# Patient Record
Sex: Male | Born: 1980 | Race: White | Hispanic: No | Marital: Married | Smoking: Current every day smoker
Health system: Southern US, Community
[De-identification: ages and names within clinical notes are randomized; demographics above are authoritative.]

## PROBLEM LIST (undated history)

## (undated) DIAGNOSIS — N200 Calculus of kidney: Secondary | ICD-10-CM

## (undated) HISTORY — PX: LEG SURGERY: SHX1003

---

## 2015-10-01 ENCOUNTER — Observation Stay
Admission: EM | Admit: 2015-10-01 | Discharge: 2015-10-02 | Disposition: A | Discharge status: Home / Self Care | Payer: Self-pay | Attending: Urology | Admitting: Urology

## 2015-10-01 ENCOUNTER — Emergency Department: Payer: Self-pay

## 2015-10-01 DIAGNOSIS — Z87442 Personal history of urinary calculi: Secondary | ICD-10-CM | POA: Insufficient documentation

## 2015-10-01 DIAGNOSIS — R350 Frequency of micturition: Secondary | ICD-10-CM | POA: Insufficient documentation

## 2015-10-01 DIAGNOSIS — R3129 Other microscopic hematuria: Secondary | ICD-10-CM | POA: Insufficient documentation

## 2015-10-01 DIAGNOSIS — R300 Dysuria: Secondary | ICD-10-CM | POA: Insufficient documentation

## 2015-10-01 DIAGNOSIS — N23 Unspecified renal colic: Secondary | ICD-10-CM | POA: Insufficient documentation

## 2015-10-01 DIAGNOSIS — N132 Hydronephrosis with renal and ureteral calculous obstruction: Principal | ICD-10-CM | POA: Insufficient documentation

## 2015-10-01 DIAGNOSIS — R109 Unspecified abdominal pain: Secondary | ICD-10-CM | POA: Diagnosis present

## 2015-10-01 DIAGNOSIS — N134 Hydroureter: Secondary | ICD-10-CM | POA: Insufficient documentation

## 2015-10-01 HISTORY — DX: Calculus of kidney: N20.0

## 2015-10-01 LAB — URINALYSIS COMPLETE WITH MICROSCOPIC (ARMC ONLY)
Bilirubin Urine: NEGATIVE
Glucose, UA: NEGATIVE mg/dL
Ketones, ur: NEGATIVE mg/dL
LEUKOCYTES UA: NEGATIVE
Nitrite: NEGATIVE
PH: 6 (ref 5.0–8.0)
PROTEIN: 100 mg/dL — AB
SPECIFIC GRAVITY, URINE: 1.025 (ref 1.005–1.030)
WBC, UA: NONE SEEN WBC/hpf (ref 0–5)

## 2015-10-01 LAB — BASIC METABOLIC PANEL
Anion gap: 6 (ref 5–15)
BUN: 19 mg/dL (ref 6–20)
CALCIUM: 9.4 mg/dL (ref 8.9–10.3)
CO2: 27 mmol/L (ref 22–32)
CREATININE: 1.11 mg/dL (ref 0.61–1.24)
Chloride: 106 mmol/L (ref 101–111)
GFR calc Af Amer: >60 mL/min (ref >60)
GFR calc non Af Amer: >60 mL/min (ref >60)
GLUCOSE: 100 mg/dL — AB (ref 65–99)
Potassium: 4 mmol/L (ref 3.5–5.1)
Sodium: 139 mmol/L (ref 135–145)

## 2015-10-01 LAB — CBC
HEMATOCRIT: 46 % (ref 40.0–52.0)
Hemoglobin: 15.4 g/dL (ref 13.0–18.0)
MCH: 31.7 pg (ref 26.0–34.0)
MCHC: 33.5 g/dL (ref 32.0–36.0)
MCV: 94.7 fL (ref 80.0–100.0)
Platelets: 252 10*3/uL (ref 150–440)
RBC: 4.86 MIL/uL (ref 4.40–5.90)
RDW: 13.7 % (ref 11.5–14.5)
WBC: 9.2 10*3/uL (ref 3.8–10.6)

## 2015-10-01 MED ORDER — HYDROMORPHONE HCL 1 MG/ML IJ SOLN
1.0000 mg | Freq: Once | INTRAMUSCULAR | Status: AC
  Administered 2015-10-01: 1 mg via INTRAVENOUS

## 2015-10-01 MED ORDER — FENTANYL CITRATE (PF) 100 MCG/2ML IJ SOLN
50.0000 ug | Freq: Once | INTRAMUSCULAR | Status: AC
  Administered 2015-10-01: 50 ug via INTRAVENOUS
  Filled 2015-10-01: qty 2

## 2015-10-01 MED ORDER — HYDROMORPHONE HCL 1 MG/ML IJ SOLN
1.0000 mg | Freq: Once | INTRAMUSCULAR | Status: AC
Start: 2015-10-01 — End: 2015-10-01
  Administered 2015-10-01: 1 mg via INTRAVENOUS
  Filled 2015-10-01: qty 1

## 2015-10-01 MED ORDER — HYDROMORPHONE HCL 1 MG/ML IJ SOLN
INTRAMUSCULAR | Status: AC
  Administered 2015-10-01: 1 mg via INTRAVENOUS
  Filled 2015-10-01: qty 1

## 2015-10-01 MED ORDER — ONDANSETRON HCL 4 MG/2ML IJ SOLN
4.0000 mg | Freq: Once | INTRAMUSCULAR | Status: AC
  Administered 2015-10-01: 4 mg via INTRAVENOUS
  Filled 2015-10-01: qty 2

## 2015-10-01 NOTE — ED Notes (Signed)
EDP at bedside  

## 2015-10-01 NOTE — ED Notes (Signed)
Back into room to let pt know MD is waiting on return call for urologist; MD is hoping to admit pt for pain control; pt has received a total of 4mg  Dilaudid and still rates pain 9/10; pt restless; no longer diaphoretic;

## 2015-10-01 NOTE — ED Notes (Addendum)
Pt c/o sudden onset of flank and back pain; history of kidney stones and says this feels the same; pt says he did notice some blood in his urine several days ago; pt restless in wheelchair; diaphoretic

## 2015-10-01 NOTE — ED Notes (Addendum)
Dr. Darnelle CatalanMalinda notified of patients still increased pain despite fentanyl.  See orders for dilaudid

## 2015-10-02 ENCOUNTER — Encounter: Payer: Self-pay | Admitting: Emergency Medicine

## 2015-10-02 DIAGNOSIS — N23 Unspecified renal colic: Secondary | ICD-10-CM

## 2015-10-02 DIAGNOSIS — R109 Unspecified abdominal pain: Secondary | ICD-10-CM | POA: Diagnosis present

## 2015-10-02 MED ORDER — SODIUM CHLORIDE 0.9 % IV SOLN
INTRAVENOUS | Status: DC
  Administered 2015-10-02: 04:00:00 via INTRAVENOUS

## 2015-10-02 MED ORDER — TAMSULOSIN HCL 0.4 MG PO CAPS: 0.4000 mg | ORAL_CAPSULE | Freq: Every day | ORAL | Status: AC

## 2015-10-02 MED ORDER — TAMSULOSIN HCL 0.4 MG PO CAPS
0.4000 mg | ORAL_CAPSULE | Freq: Every day | ORAL | Status: DC
  Administered 2015-10-02: 0.4 mg via ORAL
  Filled 2015-10-02: qty 1

## 2015-10-02 MED ORDER — KETOROLAC TROMETHAMINE 30 MG/ML IJ SOLN
30.0000 mg | Freq: Four times a day (QID) | INTRAMUSCULAR | Status: DC
  Administered 2015-10-02 (×2): 30 mg via INTRAVENOUS
  Filled 2015-10-02 (×2): qty 1

## 2015-10-02 MED ORDER — DIPHENHYDRAMINE HCL 50 MG/ML IJ SOLN: 12.5000 mg | Freq: Four times a day (QID) | INTRAMUSCULAR | Status: DC | PRN

## 2015-10-02 MED ORDER — DOCUSATE SODIUM 100 MG PO CAPS
100.0000 mg | ORAL_CAPSULE | Freq: Two times a day (BID) | ORAL | Status: DC
  Administered 2015-10-02: 100 mg via ORAL
  Filled 2015-10-02: qty 1

## 2015-10-02 MED ORDER — SODIUM CHLORIDE 0.9 % IV SOLN
Freq: Once | INTRAVENOUS | Status: AC
  Administered 2015-10-02: 1000 mL via INTRAVENOUS

## 2015-10-02 MED ORDER — DIPHENHYDRAMINE HCL 12.5 MG/5ML PO ELIX: 12.5000 mg | ORAL_SOLUTION | Freq: Four times a day (QID) | ORAL | Status: DC | PRN

## 2015-10-02 MED ORDER — HYDROMORPHONE HCL 1 MG/ML IJ SOLN
0.5000 mg | INTRAMUSCULAR | Status: DC | PRN
  Administered 2015-10-02 (×4): 1 mg via INTRAVENOUS
  Filled 2015-10-02 (×4): qty 1

## 2015-10-02 MED ORDER — HYDROMORPHONE HCL 2 MG PO TABS: 2.0000 mg | ORAL_TABLET | ORAL | Status: AC | PRN

## 2015-10-02 MED ORDER — ONDANSETRON HCL 4 MG/2ML IJ SOLN: 4.0000 mg | INTRAMUSCULAR | Status: DC | PRN

## 2015-10-02 MED ORDER — OXYCODONE-ACETAMINOPHEN 5-325 MG PO TABS
1.0000 | ORAL_TABLET | ORAL | Status: DC | PRN
  Administered 2015-10-02: 1 via ORAL
  Filled 2015-10-02: qty 1

## 2015-10-02 MED ORDER — HYDROMORPHONE HCL 1 MG/ML IJ SOLN
1.0000 mg | Freq: Once | INTRAMUSCULAR | Status: AC
  Administered 2015-10-02: 1 mg via INTRAVENOUS
  Filled 2015-10-02: qty 1

## 2015-10-02 MED ORDER — ACETAMINOPHEN 325 MG PO TABS: 650.0000 mg | ORAL_TABLET | ORAL | Status: DC | PRN

## 2015-10-02 NOTE — Progress Notes (Signed)
Discharge instructions explained to pt/ verbalized an understanding/ iv removed/ rx given to pt/ will transport off unit via wheelchair.

## 2015-10-02 NOTE — H&P (Signed)
I have been asked to see the patient by Dr Darnelle Catalan., for evaluation and management of left flank pain.  Chief Complaint: right flank pain  History of Present Illness: Tyler Rodriguez is a 34 y.o. year old who presented to the ED overnight with left flank pain x multiple days, dysuria, and urinary frequency.  No fevers or chills.  No leukocytosis or evidence of UTI.  CT scan shows small 5 mm left distal ureteral stone with proximal mild hydroureternephrosis.    He has had severe pain requiring IV dilaudid 4 mg in the ED.   He does have a history of stones, able to pass these stones previously without intervention.  Past Medical History: negative  Past Surgical History: negative   Home Medications:    Medication List    Notice    You have not been prescribed any medications.      Allergies: No Known Allergies  No family history on file.  Social History:  has no tobacco, alcohol, and drug history on file.  ROS: A complete review of systems was performed.  All systems are negative except for pertinent findings as noted.  Physical Exam:  Vital signs in last 24 hours: Temp:  [97.5 F (36.4 C)] 97.5 F (36.4 C) (12/17 2046) Pulse Rate:  [67-99] 94 (12/18 0100) Resp:  [20-24] 20 (12/17 2243) BP: (111-148)/(75-100) 111/76 mmHg (12/18 0100) SpO2:  [94 %-100 %] 100 % (12/18 0100) Weight:  [160 lb (72.576 kg)] 160 lb (72.576 kg) (12/17 2046) Constitutional:  Alert and oriented, No acute distress HEENT: [place] AT, moist mucus membranes.  Trachea midline, no masses Cardiovascular: Regular rate and rhythm, no clubbing, cyanosis, or edema. Respiratory: Normal respiratory effort, lungs clear bilaterally GI: Abdomen is soft, nontender, nondistended, no abdominal masses GU: + R CVA tenderness Skin: No rashes, bruises or suspicious lesions Lymph: No cervical or inguinal adenopathy Neurologic: Grossly intact, no focal deficits, moving all 4 extremities Psychiatric: Normal mood and  affect   Laboratory Data:   Recent Labs  10/01/15 2116  WBC 9.2  HGB 15.4  HCT 46.0    Recent Labs  10/01/15 2116  NA 139  K 4.0  CL 106  CO2 27  GLUCOSE 100*  BUN 19  CREATININE 1.11  CALCIUM 9.4   Urinalysis Color, Urine YELLOW  AMBER (A)   APPearance CLEAR  CLOUDY (A)   Glucose, UA NEGATIVE mg/dL NEGATIVE   Bilirubin Urine NEGATIVE  NEGATIVE   Ketones, ur NEGATIVE mg/dL NEGATIVE   Specific Gravity, Urine 1.005 - 1.030  1.025   Hgb urine dipstick NEGATIVE  3+ (A)   pH 5.0 - 8.0  6.0   Protein, ur NEGATIVE mg/dL 865 (A)   Nitrite NEGATIVE  NEGATIVE   Leukocytes, UA NEGATIVE  NEGATIVE   RBC / HPF 0 - 5 RBC/hpf TOO NUMEROUS TO COUNT   WBC, UA 0 - 5 WBC/hpf NONE SEEN   Bacteria, UA NONE SEEN  RARE (A)   Squamous Epithelial / LPF NONE SEEN  0-5 (A)   Mucous  PRESENT   Ca Oxalate Crys, UA  PRESENT           Radiologic Imaging: Ct Renal Stone Study  10/01/2015  CLINICAL DATA:  Sudden onset flank pain and back pain. Blood in the urine several days ago. Blood in urine today. Pain is in the left flank. History of kidney stones. EXAM: CT ABDOMEN AND PELVIS WITHOUT CONTRAST TECHNIQUE: Multidetector CT imaging of the abdomen and pelvis  was performed following the standard protocol without IV contrast. COMPARISON:  None. FINDINGS: Lung bases are clear. 5 mm stone in the distal left ureter at the level of the superior acetabulum. There is proximal hydronephrosis and hydroureter on the left. Mild stranding in the left renal hilum and periureteral region. Additional punctate size stones within the left kidney. Right kidney in ureter are decompressed. Bladder is decompressed. The unenhanced appearance of the liver, spleen, gallbladder, pancreas, adrenal glands, abdominal aorta, inferior vena cava, and retroperitoneal lymph nodes is unremarkable. Stomach, small bowel, and colon are not abnormally distended. No free air or free fluid in the abdomen. Abdominal wall  musculature appears intact. Pelvis: The appendix is normal. Prostate gland is not enlarged. No free or loculated pelvic fluid collections. No pelvic mass or lymphadenopathy. No destructive bone lesions. IMPRESSION: 5 mm stone in the distal left ureter with moderate proximal obstruction. Electronically Signed   By: Burman NievesWilliam  Stevens M.D.   On: 10/01/2015 22:32    Impression/Assessment:   34 yo M with severe left flank pain with 5 mm distal left ureteral stone.  No evidence of leukocytosis, fever, chills or UTI therefore no need for emergent intervention.  Given his poorly controlled pain, plan to admit overnight for IV pain medications and aggressive IV fluids with trial of medical expulsive therapy.  1. Left distal ureteral stone  2. Left hydronephrosis, mild, secondary to #1  3. Left flank pain, secondary to #1, poorly controlled.  4. Microscopic hematuria, secondary to #1  Plan:  -admit  -IVF @150  -strain urine -flomax -IV pain mediation -NPO, consider possible stent later today if pain does not improve  10/02/2015, 1:13 AM  Vanna ScotlandAshley Laverne Hursey,  MD

## 2015-10-02 NOTE — Discharge Summary (Signed)
Date of admission: 10/01/2015  Date of discharge: 10/02/2015  Admission diagnosis: Left ureteral stone, left flank pain  Discharge diagnosis: As above  Secondary diagnoses:  Patient Active Problem List   Diagnosis Date Noted  . Flank pain 10/02/2015  . Renal colic on left side     History and Physical: For full details, please see admission history and physical. Briefly, Tyler Rodriguez is a 34 y.o. year old patient with 5 mm left distal obstructing ureteral stone. He is admitted for pain control.Marland Kitchen.   Hospital Course: His hospital course was uncomplicated. On the day of discharge criteria: was eating a regular diet, was up and ambulating independently,  pain was well controlled, was voiding without a catheter, and was ready to for discharge.  He did not pass his stone, however, his pain was dramatically improved.   Laboratory values:   Recent Labs  10/01/15 2116  WBC 9.2  HGB 15.4  HCT 46.0    Recent Labs  10/01/15 2116  NA 139  K 4.0  CL 106  CO2 27  GLUCOSE 100*  BUN 19  CREATININE 1.11  CALCIUM 9.4    Disposition: Home  Discharge instruction: See AVS discharge instructions.  Discharge medications:   Medication List    TAKE these medications        HYDROmorphone 2 MG tablet  Commonly known as:  DILAUDID  Take 1 tablet (2 mg total) by mouth every 4 (four) hours as needed for severe pain. Take 1-2 tabs     tamsulosin 0.4 MG Caps capsule  Commonly known as:  FLOMAX  Take 1 capsule (0.4 mg total) by mouth daily.        Followup:      Follow-up Information    Follow up with Vanna ScotlandAshley Tyrea Froberg, MD. Call in 1 week.   Specialty:  Urology   Why:  For follow-up with [facility] prior   Contact information:   [address] [place] [id number] [phone]

## 2015-10-02 NOTE — ED Notes (Signed)
Pt says he's feeling some better; would like to go home if possible; discussed difference between admission and going home in regards to pain control; pt questioning if this is a stone he could possibly pass at home or not; informed MD pt has some further questions; Dr Darnelle CatalanMalinda in to speak with pt again regarding admission or discharge

## 2015-10-02 NOTE — ED Notes (Signed)
On the phone holding with the floor to give report when pharmacy tech notifies me pt wants to go home with IV fluids completed; in to talk with pt who again stated that he just wants to go home after fluids infused; Dr Darnelle CatalanMalinda aware; pt agrees to sign AMA form when time for discharge; floor notified pt will not be coming to room 229

## 2015-10-02 NOTE — ED Provider Notes (Addendum)
[facility] Emergency Department Provider Note  ____________________________________________  Time seen: Approximately 12:43 AM  I have reviewed the triage vital signs and the nursing notes.   HISTORY  Chief Complaint Flank Pain    HPI Tyler Rodriguez is a 34 y.o. male patient reports a past history of kidney stones. He reports she's had some blood in the ER for last few days. Began having right sided CVA and flank pain. He has a lot of dysuria. He feels like he has to go but can't. Pain is severe. When I see him he is writhing around on the bed looks pale and sweaty. Patient needs 4 mg of Dilantin IV 1 at a time before his pain is controlled. CT shows a 5 mm kidney stone. We are unable to get the urologist up to this point as I would like to admit him for pain control.  No past medical history on file.  There are no active problems to display for this patient.   No past surgical history on file.  No current outpatient prescriptions on file.  Allergies Review of patient's allergies indicates no known allergies.  No family history on file.  Social History Social History  Substance Use Topics  . Smoking status: Not on file  . Smokeless tobacco: Not on file  . Alcohol Use: Not on file    Review of Systems Constitutional: No fever/chills Eyes: No visual changes. ENT: No sore throat. Cardiovascular: Denies chest pain. Respiratory: Denies shortness of breath. Gastrointestinal:abdominal pain.  No nausea, no vomiting.  No diarrhea.  No constipation. Genitourinary dysuria. Musculoskeletal: r back pain. Skin: Negative for rash. Neurological: Negative for headaches, focal weakness or numbness.  10-point ROS otherwise negative.  ____________________________________________   PHYSICAL EXAM:  VITAL SIGNS: ED Triage Vitals  Enc Vitals Group     BP 10/01/15 2046 129/82 mmHg     Pulse Rate 10/01/15 2046 99     Resp 10/01/15 2117 24     Temp 10/01/15  2046 97.5 F (36.4 C)     Temp Source 10/01/15 2046 Oral     SpO2 10/01/15 2046 97 %     Weight 10/01/15 2046 160 lb (72.576 kg)     Height 10/01/15 2046  (1.778 m)     Head Cir --      Peak Flow --      Pain Score 10/01/15 2046 10     Pain Loc --      Pain Edu? --      Excl. in GC? --   Constitutional: Alert and oriented. Well appearing and in no acute distress. Eyes: Conjunctivae are normal. PERRL. EOMI. Head: Atraumatic. Nose: No congestion/rhinnorhea. Mouth/Throat: Mucous membranes are moist.  Oropharynx non-erythematous. Neck: No stridor.  Cardiovascular: Normal rate, regular rhythm. Grossly normal heart sounds.  Good peripheral circulation. Respiratory: Normal respiratory effort.  No retractions. Lungs CTAB. Gastrointestinal: Soft and nontender. No distention. No abdominal bruits. No CVA tenderness. Musculoskeletal: No lower extremity tenderness nor edema.  No joint effusions. Neurologic:  Normal speech and language. No gross focal neurologic deficits are appreciated. No gait instability. Skin:  Skin is warm, dry and intact. No rash noted.   ____________________________________________   LABS (all labs ordered are listed, but only abnormal results are displayed)  Labs Reviewed  BASIC METABOLIC PANEL - Abnormal; Notable for the following:    Glucose, Bld 100 (*)    All other components within normal limits  URINALYSIS COMPLETEWITH MICROSCOPIC ([facility] ONLY) - Abnormal; Notable  for the following:    Color, Urine AMBER (*)    APPearance CLOUDY (*)    Hgb urine dipstick 3+ (*)    Protein, ur 100 (*)    Bacteria, UA RARE (*)    Squamous Epithelial / LPF 0-5 (*)    All other components within normal limits  CBC   ____________________________________________  EKG   ____________________________________________  RADIOLOGY  CT read by radiology shows 5 mm distal stone with partial  obstruction   PROCEDURES   ____________________________________________   INITIAL IMPRESSION / ASSESSMENT AND PLAN / ED COURSE  Pertinent labs & imaging results that were available during my care of the patient were reviewed by me and considered in my medical decision making (see chart for details).  _______________________________   FINAL CLINICAL IMPRESSION(S) / ED DIAGNOSES  Final diagnoses:  Left flank pain      Arnaldo NatalPaul F Willadene Mounsey, MD 10/02/15 0045  Urologist will admit the patient for pain control  Arnaldo NatalPaul F Ashlley Booher, MD 10/02/15 [phone]  Please note the pain in the stone on the left side not the right side  Arnaldo NatalPaul F Eriq Hufford, MD 10/02/15 0127

## 2015-10-02 NOTE — Discharge Instructions (Signed)
Kidney Stones °Kidney stones (urolithiasis) are deposits that form inside your kidneys. The intense pain is caused by the stone moving through the urinary tract. When the stone moves, the ureter goes into spasm around the stone. The stone is usually passed in the urine.  °CAUSES  °· A disorder that makes certain neck glands produce too much parathyroid hormone (primary hyperparathyroidism). °· A buildup of uric acid crystals, similar to gout in your joints. °· Narrowing (stricture) of the ureter. °· A kidney obstruction present at birth (congenital obstruction). °· Previous surgery on the kidney or ureters. °· Numerous kidney infections. °SYMPTOMS  °· Feeling sick to your stomach (nauseous). °· Throwing up (vomiting). °· Blood in the urine (hematuria). °· Pain that usually spreads (radiates) to the groin. °· Frequency or urgency of urination. °DIAGNOSIS  °· Taking a history and physical exam. °· Blood or urine tests. °· CT scan. °· Occasionally, an examination of the inside of the urinary bladder (cystoscopy) is performed. °TREATMENT  °· Observation. °· Increasing your fluid intake. °· Extracorporeal shock wave lithotripsy--This is a noninvasive procedure that uses shock waves to break up kidney stones. °· Surgery may be needed if you have severe pain or persistent obstruction. There are various surgical procedures. Most of the procedures are performed with the use of small instruments. Only small incisions are needed to accommodate these instruments, so recovery time is minimized. °The size, location, and chemical composition are all important variables that will determine the proper choice of action for you. Talk to your health care provider to better understand your situation so that you will minimize the risk of injury to yourself and your kidney.  °HOME CARE INSTRUCTIONS  °· Drink enough water and fluids to keep your urine clear or pale yellow. This will help you to pass the stone or stone fragments. °· Strain  all urine through the provided strainer. Keep all particulate matter and stones for your health care provider to see. The stone causing the pain may be as small as a grain of salt. It is very important to use the strainer each and every time you pass your urine. The collection of your stone will allow your health care provider to analyze it and verify that a stone has actually passed. The stone analysis will often identify what you can do to reduce the incidence of recurrences. °· Only take over-the-counter or prescription medicines for pain, discomfort, or fever as directed by your health care provider. °· Keep all follow-up visits as told by your health care provider. This is important. °· Get follow-up X-rays if required. The absence of pain does not always mean that the stone has passed. It may have only stopped moving. If the urine remains completely obstructed, it can cause loss of kidney function or even complete destruction of the kidney. It is your responsibility to make sure X-rays and follow-ups are completed. Ultrasounds of the kidney can show blockages and the status of the kidney. Ultrasounds are not associated with any radiation and can be performed easily in a matter of minutes. °· Make changes to your daily diet as told by your health care provider. You may be told to: °¨ Limit the amount of salt that you eat. °¨ Eat 5 or more servings of fruits and vegetables each day. °¨ Limit the amount of meat, poultry, fish, and eggs that you eat. °· Collect a 24-hour urine sample as told by your health care provider. You may need to collect another urine sample every 6-12   months. °SEEK MEDICAL CARE IF: °· You experience pain that is progressive and unresponsive to any pain medicine you have been prescribed. °SEEK IMMEDIATE MEDICAL CARE IF:  °· Pain cannot be controlled with the prescribed medicine. °· You have a fever or shaking chills. °· The severity or intensity of pain increases over 18 hours and is not  relieved by pain medicine. °· You develop a new onset of abdominal pain. °· You feel faint or pass out. °· You are unable to urinate. °  °This information is not intended to replace advice given to you by your health care provider. Make sure you discuss any questions you have with your health care provider. °  °Document Released: 10/01/2005 Document Revised: 06/22/2015 Document Reviewed: 03/04/2013 °Elsevier Interactive Patient Education ©2016 [employer] ° °

## 2015-10-02 NOTE — Plan of Care (Signed)
Problem: Physical Regulation: Goal: Ability to maintain clinical measurements within normal limits will improve Outcome: Progressing Pt c/o 10/10 pain left flank pain. Pain managed with Toradol and Dilaudid

## 2015-10-02 NOTE — ED Notes (Signed)
Pt using call bell; says pain has returned and "I'm going to have to stay"; Dr Darnelle CatalanMalinda aware and pt to be admitted to room 229; report called to Crystal on 2C

## 2015-10-10 ENCOUNTER — Emergency Department: Payer: Self-pay

## 2015-10-10 ENCOUNTER — Emergency Department
Admission: EM | Admit: 2015-10-10 | Discharge: 2015-10-10 | Disposition: A | Discharge status: Home / Self Care | Payer: Self-pay | Attending: Emergency Medicine | Admitting: Emergency Medicine

## 2015-10-10 DIAGNOSIS — F17200 Nicotine dependence, unspecified, uncomplicated: Secondary | ICD-10-CM | POA: Insufficient documentation

## 2015-10-10 DIAGNOSIS — N23 Unspecified renal colic: Secondary | ICD-10-CM | POA: Insufficient documentation

## 2015-10-10 DIAGNOSIS — Z79899 Other long term (current) drug therapy: Secondary | ICD-10-CM | POA: Insufficient documentation

## 2015-10-10 DIAGNOSIS — J209 Acute bronchitis, unspecified: Secondary | ICD-10-CM | POA: Insufficient documentation

## 2015-10-10 DIAGNOSIS — R091 Pleurisy: Secondary | ICD-10-CM | POA: Insufficient documentation

## 2015-10-10 LAB — COMPREHENSIVE METABOLIC PANEL
ALT: 15 U/L — AB (ref 17–63)
AST: 24 U/L (ref 15–41)
Albumin: 4.3 g/dL (ref 3.5–5.0)
Alkaline Phosphatase: 48 U/L (ref 38–126)
Anion gap: 7 (ref 5–15)
BILIRUBIN TOTAL: 1.4 mg/dL — AB (ref 0.3–1.2)
BUN: 13 mg/dL (ref 6–20)
CALCIUM: 9.2 mg/dL (ref 8.9–10.3)
CO2: 29 mmol/L (ref 22–32)
CREATININE: 0.79 mg/dL (ref 0.61–1.24)
Chloride: 104 mmol/L (ref 101–111)
GFR calc Af Amer: >60 mL/min (ref >60)
Glucose, Bld: 119 mg/dL — ABNORMAL HIGH (ref 65–99)
Potassium: 4.2 mmol/L (ref 3.5–5.1)
Sodium: 140 mmol/L (ref 135–145)
TOTAL PROTEIN: 7.4 g/dL (ref 6.5–8.1)

## 2015-10-10 LAB — URINALYSIS COMPLETE WITH MICROSCOPIC (ARMC ONLY)
Bacteria, UA: NONE SEEN
Bilirubin Urine: NEGATIVE
GLUCOSE, UA: NEGATIVE mg/dL
LEUKOCYTES UA: NEGATIVE
NITRITE: NEGATIVE
Protein, ur: 30 mg/dL — AB
SPECIFIC GRAVITY, URINE: 1.024 (ref 1.005–1.030)
SQUAMOUS EPITHELIAL / LPF: NONE SEEN
pH: 5 (ref 5.0–8.0)

## 2015-10-10 LAB — CBC
HCT: 48.6 % (ref 40.0–52.0)
Hemoglobin: 16.2 g/dL (ref 13.0–18.0)
MCH: 31.9 pg (ref 26.0–34.0)
MCHC: 33.4 g/dL (ref 32.0–36.0)
MCV: 95.5 fL (ref 80.0–100.0)
PLATELETS: 234 10*3/uL (ref 150–440)
RBC: 5.09 MIL/uL (ref 4.40–5.90)
RDW: 14.4 % (ref 11.5–14.5)
WBC: 7.1 10*3/uL (ref 3.8–10.6)

## 2015-10-10 LAB — LIPASE, BLOOD: Lipase: 20 U/L (ref 11–51)

## 2015-10-10 MED ORDER — ACETAMINOPHEN-CODEINE #2 300-15 MG PO TABS: 1.0000 | ORAL_TABLET | ORAL | Status: AC | PRN

## 2015-10-10 NOTE — ED Notes (Signed)
Pt states that he was dx with kidney stone X 1 week ago. Pt reports LLQ abdominal pain that began this AM, worsening. Pt unsure if he has passed his kidney stone or not. Pt alert and oriented X4, active, cooperative, pt in NAD. RR even and unlabored, color WNL.

## 2015-10-10 NOTE — Discharge Instructions (Signed)
Pleurisy Pleurisy is an inflammation and swelling of the lining of the lungs (pleura). Because of this inflammation, it hurts to breathe. It can be aggravated by coughing, laughing, or deep breathing. Pleurisy is often caused by an underlying infection or disease.  HOME CARE INSTRUCTIONS  Monitor your pleurisy for any changes. The following actions may help to alleviate any discomfort you are experiencing:  Medicine may help with pain. Only take over-the-counter or prescription medicines for pain, discomfort, or fever as directed by your health care provider.  Only take antibiotic medicine as directed. Make sure to finish it even if you start to feel better. SEEK MEDICAL CARE IF:   Your pain is not controlled with medicine or is increasing.  You have an increase in pus-like (purulent) secretions brought up with coughing. SEEK IMMEDIATE MEDICAL CARE IF:   You have blue or dark lips, fingernails, or toenails.  You are coughing up blood.  You have increased difficulty breathing.  You have continuing pain unrelieved by medicine or pain lasting more than 1 week.  You have pain that radiates into your neck, arms, or jaw.  You develop increased shortness of breath or wheezing.  You develop a fever, rash, vomiting, fainting, or other serious symptoms. MAKE SURE YOU:  Understand these instructions.   Will watch your condition.   Will get help right away if you are not doing well or get worse.    This information is not intended to replace advice given to you by your health care provider. Make sure you discuss any questions you have with your health care provider.   Document Released: 10/01/2005 Document Revised: 06/03/2013 Document Reviewed: 03/15/2013 Elsevier Interactive Patient Education 2016 Elsevier Inc.  Kidney Stones Kidney stones (urolithiasis) are deposits that form inside your kidneys. The intense pain is caused by the stone moving through the urinary tract. When the  stone moves, the ureter goes into spasm around the stone. The stone is usually passed in the urine.  CAUSES   A disorder that makes certain neck glands produce too much parathyroid hormone (primary hyperparathyroidism).  A buildup of uric acid crystals, similar to gout in your joints.  Narrowing (stricture) of the ureter.  A kidney obstruction present at birth (congenital obstruction).  Previous surgery on the kidney or ureters.  Numerous kidney infections. SYMPTOMS   Feeling sick to your stomach (nauseous).  Throwing up (vomiting).  Blood in the urine (hematuria).  Pain that usually spreads (radiates) to the groin.  Frequency or urgency of urination. DIAGNOSIS   Taking a history and physical exam.  Blood or urine tests.  CT scan.  Occasionally, an examination of the inside of the urinary bladder (cystoscopy) is performed. TREATMENT   Observation.  Increasing your fluid intake.  Extracorporeal shock wave lithotripsy--This is a noninvasive procedure that uses shock waves to break up kidney stones.  Surgery may be needed if you have severe pain or persistent obstruction. There are various surgical procedures. Most of the procedures are performed with the use of small instruments. Only small incisions are needed to accommodate these instruments, so recovery time is minimized. The size, location, and chemical composition are all important variables that will determine the proper choice of action for you. Talk to your health care provider to better understand your situation so that you will minimize the risk of injury to yourself and your kidney.  HOME CARE INSTRUCTIONS   Drink enough water and fluids to keep your urine clear or pale yellow. This will help  you to pass the stone or stone fragments.  Strain all urine through the provided strainer. Keep all particulate matter and stones for your health care provider to see. The stone causing the pain may be as small as a grain  of salt. It is very important to use the strainer each and every time you pass your urine. The collection of your stone will allow your health care provider to analyze it and verify that a stone has actually passed. The stone analysis will often identify what you can do to reduce the incidence of recurrences.  Only take over-the-counter or prescription medicines for pain, discomfort, or fever as directed by your health care provider.  Keep all follow-up visits as told by your health care provider. This is important.  Get follow-up X-rays if required. The absence of pain does not always mean that the stone has passed. It may have only stopped moving. If the urine remains completely obstructed, it can cause loss of kidney function or even complete destruction of the kidney. It is your responsibility to make sure X-rays and follow-ups are completed. Ultrasounds of the kidney can show blockages and the status of the kidney. Ultrasounds are not associated with any radiation and can be performed easily in a matter of minutes.  Make changes to your daily diet as told by your health care provider. You may be told to:  Limit the amount of salt that you eat.  Eat 5 or more servings of fruits and vegetables each day.  Limit the amount of meat, poultry, fish, and eggs that you eat.  Collect a 24-hour urine sample as told by your health care provider.You may need to collect another urine sample every 6-12 months. SEEK MEDICAL CARE IF:  You experience pain that is progressive and unresponsive to any pain medicine you have been prescribed. SEEK IMMEDIATE MEDICAL CARE IF:   Pain cannot be controlled with the prescribed medicine.  You have a fever or shaking chills.  The severity or intensity of pain increases over 18 hours and is not relieved by pain medicine.  You develop a new onset of abdominal pain.  You feel faint or pass out.  You are unable to urinate.   This information is not intended to  replace advice given to you by your health care provider. Make sure you discuss any questions you have with your health care provider.   Document Released: 10/01/2005 Document Revised: 06/22/2015 Document Reviewed: 03/04/2013 Elsevier Interactive Patient Education 2016 Elsevier Inc.  Acute Bronchitis Bronchitis is inflammation of the airways that extend from the windpipe into the lungs (bronchi). The inflammation often causes mucus to develop. This leads to a cough, which is the most common symptom of bronchitis.  In acute bronchitis, the condition usually develops suddenly and goes away over time, usually in a couple weeks. Smoking, allergies, and asthma can make bronchitis worse. Repeated episodes of bronchitis may cause further lung problems.  CAUSES Acute bronchitis is most often caused by the same virus that causes a cold. The virus can spread from person to person (contagious) through coughing, sneezing, and touching contaminated objects. SIGNS AND SYMPTOMS   Cough.   Fever.   Coughing up mucus.   Body aches.   Chest congestion.   Chills.   Shortness of breath.   Sore throat.  DIAGNOSIS  Acute bronchitis is usually diagnosed through a physical exam. Your health care provider will also ask you questions about your medical history. Tests, such as chest X-rays, are  sometimes done to rule out other conditions.  TREATMENT  Acute bronchitis usually goes away in a couple weeks. Oftentimes, no medical treatment is necessary. Medicines are sometimes given for relief of fever or cough. Antibiotic medicines are usually not needed but may be prescribed in certain situations. In some cases, an inhaler may be recommended to help reduce shortness of breath and control the cough. A cool mist vaporizer may also be used to help thin bronchial secretions and make it easier to clear the chest.  HOME CARE INSTRUCTIONS  Get plenty of rest.   Drink enough fluids to keep your urine clear  or pale yellow (unless you have a medical condition that requires fluid restriction). Increasing fluids may help thin your respiratory secretions (sputum) and reduce chest congestion, and it will prevent dehydration.   Take medicines only as directed by your health care provider.  If you were prescribed an antibiotic medicine, finish it all even if you start to feel better.  Avoid smoking and secondhand smoke. Exposure to cigarette smoke or irritating chemicals will make bronchitis worse. If you are a smoker, consider using nicotine gum or skin patches to help control withdrawal symptoms. Quitting smoking will help your lungs heal faster.   Reduce the chances of another bout of acute bronchitis by washing your hands frequently, avoiding people with cold symptoms, and trying not to touch your hands to your mouth, nose, or eyes.   Keep all follow-up visits as directed by your health care provider.  SEEK MEDICAL CARE IF: Your symptoms do not improve after 1 week of treatment.  SEEK IMMEDIATE MEDICAL CARE IF:  You develop an increased fever or chills.   You have chest pain.   You have severe shortness of breath.  You have bloody sputum.   You develop dehydration.  You faint or repeatedly feel like you are going to pass out.  You develop repeated vomiting.  You develop a severe headache. MAKE SURE YOU:   Understand these instructions.  Will watch your condition.  Will get help right away if you are not doing well or get worse.   This information is not intended to replace advice given to you by your health care provider. Make sure you discuss any questions you have with your health care provider.   Document Released: 11/08/2004 Document Revised: 10/22/2014 Document Reviewed: 03/24/2013 Elsevier Interactive Patient Education [employer] Elsevier Inc.

## 2015-10-10 NOTE — ED Notes (Signed)
Pt c/o left sided pain in the chest hurting with movement and cough.

## 2015-10-10 NOTE — ED Provider Notes (Signed)
[facility] Emergency Department Provider Note  ____________________________________________  Time seen: 1:30 PM  I have reviewed the triage vital signs and the nursing notes.   HISTORY  Chief Complaint Abdominal Cramping    HPI Tyler Rodriguez is a 34 y.o. male sensory history of reproducible left chest discomfort with coughing and deep inspiration. Of note patient admits to being diagnosed with kidney stone approximately one week ago here in [facility]. Patient now returns stating that he's had a cough for 3 days with resultant left chest pain 2 days with coughing. He states pain is worse with coughing and movement.    Past Medical History  Diagnosis Date  . Kidney stones     Patient Active Problem List   Diagnosis Date Noted  . Flank pain 10/02/2015  . Renal colic on left side     Past Surgical History  Procedure Laterality Date  . Leg surgery Left     Current Outpatient Rx  Name  Route  Sig  Dispense  Refill  . acetaminophen-codeine (TYLENOL #2) 300-15 MG tablet   Oral   Take 1 tablet by mouth every 4 (four) hours as needed for moderate pain.   20 tablet   0   . HYDROmorphone (DILAUDID) 2 MG tablet   Oral   Take 1 tablet (2 mg total) by mouth every 4 (four) hours as needed for severe pain. Take 1-2 tabs   30 tablet   0   . tamsulosin (FLOMAX) 0.4 MG CAPS capsule   Oral   Take 1 capsule (0.4 mg total) by mouth daily.   30 capsule   0     Allergies Review of patient's allergies indicates no known allergies.  No family history on file.  Social History Social History  Substance Use Topics  . Smoking status: Current Every Day Smoker  . Smokeless tobacco: None  . Alcohol Use: No    Review of Systems  Constitutional: Negative for fever. Eyes: Negative for visual changes. ENT: Negative for sore throat. Cardiovascular: Positive for chest pain. Respiratory: Negative for shortness of breath. Positive for  cough Gastrointestinal: Negative for abdominal pain, vomiting and diarrhea. Genitourinary: Negative for dysuria. Musculoskeletal: Negative for back pain. Skin: Negative for rash. Neurological: Negative for headaches, focal weakness or numbness.   10-point ROS otherwise negative.  ____________________________________________   PHYSICAL EXAM:  VITAL SIGNS: ED Triage Vitals  Enc Vitals Group     BP 10/10/15 1324 131/94 mmHg     Pulse Rate 10/10/15 1324 117     Resp 10/10/15 1324 18     Temp 10/10/15 1324 97.9 F (36.6 C)     Temp Source 10/10/15 1324 Oral     SpO2 10/10/15 1324 99 %     Weight 10/10/15 1324 165 lb (74.844 kg)     Height 10/10/15 1324  (1.778 m)     Head Cir --      Peak Flow --      Pain Score 10/10/15 1325 6     Pain Loc --      Pain Edu? --      Excl. in GC? --      Constitutional: Alert and oriented. Well appearing and in no distress. Eyes: Conjunctivae are normal. PERRL. Normal extraocular movements. ENT   Head: Normocephalic and atraumatic.   Nose: No congestion/rhinnorhea.   Mouth/Throat: Mucous membranes are moist.   Neck: No stridor. Hematological/Lymphatic/Immunilogical: No cervical lymphadenopathy. Cardiovascular: Normal rate, regular rhythm. Normal and symmetric distal  pulses are present in all extremities. No murmurs, rubs, or gallops. Respiratory: Normal respiratory effort without tachypnea nor retractions. Breath sounds are clear and equal bilaterally. No wheezes/rales/rhonchi. Gastrointestinal: Soft and nontender. No distention. There is no CVA tenderness. Genitourinary: deferred Musculoskeletal: Nontender with normal range of motion in all extremities. No joint effusions.  No lower extremity tenderness nor edema. Neurologic:  Normal speech and language. No gross focal neurologic deficits are appreciated. Speech is normal.  Skin:  Skin is warm, dry and intact. No rash noted. Psychiatric: Mood and affect are normal.  Speech and behavior are normal. Patient exhibits appropriate insight and judgment.  ____________________________________________    LABS (pertinent positives/negatives)  Labs Reviewed  COMPREHENSIVE METABOLIC PANEL - Abnormal; Notable for the following:    Glucose, Bld 119 (*)    ALT 15 (*)    Total Bilirubin 1.4 (*)    All other components within normal limits  URINALYSIS COMPLETEWITH MICROSCOPIC ([facility] ONLY) - Abnormal; Notable for the following:    Color, Urine YELLOW (*)    APPearance CLEAR (*)    Ketones, ur 1+ (*)    Hgb urine dipstick 1+ (*)    Protein, ur 30 (*)    All other components within normal limits  LIPASE, BLOOD  CBC     ____________________________________________   EKG  ED ECG REPORT I, BROWN, Orick N, the attending physician, personally viewed and interpreted this ECG.   Date: 10/10/2015  EKG Time: 1:31 PM  Rate: 120  Rhythm: Sinus tachycardia  Axis: None  Intervals: Normal  ST&T Change: None   ____________________________________________    RADIOLOGY    DG Chest 2 View (Final result) Result time: 10/10/15 14:40:33   Final result by Rad Results In Interface (10/10/15 14:40:33)   Narrative:   CLINICAL DATA: Left-sided chest pain. Cough. Symptoms for 2 days.  EXAM: CHEST 2 VIEW  COMPARISON: None.  FINDINGS: The lungs are clear. Heart size is normal. No pneumothorax or pleural effusion. No focal bony abnormality appear  IMPRESSION: Negative chest.   Electronically Signed By: Drusilla Kannerhomas Dalessio M.D. On: 10/10/2015 14:40          INITIAL IMPRESSION / ASSESSMENT AND PLAN / ED COURSE  Pertinent labs & imaging results that were available during my care of the patient were reviewed by me and considered in my medical decision making (see chart for details).   ____________________________________________   FINAL CLINICAL IMPRESSION(S) / ED DIAGNOSES  Final diagnoses:  Pleurisy  Renal colic on left side   Acute bronchitis, unspecified organism      Darci Currentandolph N Brown, MD 10/10/15 1510

## 2015-10-10 NOTE — ED Notes (Signed)
MD at bedside. 

## 2016-10-29 ENCOUNTER — Emergency Department
Admission: EM | Admit: 2016-10-29 | Discharge: 2016-10-29 | Disposition: A | Discharge status: Home / Self Care | Payer: Self-pay | Attending: Emergency Medicine | Admitting: Emergency Medicine

## 2016-10-29 ENCOUNTER — Emergency Department: Payer: Self-pay

## 2016-10-29 DIAGNOSIS — F17200 Nicotine dependence, unspecified, uncomplicated: Secondary | ICD-10-CM | POA: Insufficient documentation

## 2016-10-29 DIAGNOSIS — M545 Low back pain, unspecified: Secondary | ICD-10-CM

## 2016-10-29 DIAGNOSIS — N503 Cyst of epididymis: Secondary | ICD-10-CM | POA: Insufficient documentation

## 2016-10-29 DIAGNOSIS — N50811 Right testicular pain: Secondary | ICD-10-CM

## 2016-10-29 DIAGNOSIS — R1031 Right lower quadrant pain: Secondary | ICD-10-CM

## 2016-10-29 LAB — URINALYSIS, COMPLETE (UACMP) WITH MICROSCOPIC
Bacteria, UA: NONE SEEN
Bilirubin Urine: NEGATIVE
Glucose, UA: NEGATIVE mg/dL
Hgb urine dipstick: NEGATIVE
Ketones, ur: NEGATIVE mg/dL
Leukocytes, UA: NEGATIVE
Nitrite: NEGATIVE
PH: 5 (ref 5.0–8.0)
Protein, ur: NEGATIVE mg/dL
SPECIFIC GRAVITY, URINE: 1.024 (ref 1.005–1.030)

## 2016-10-29 LAB — BASIC METABOLIC PANEL
ANION GAP: 6 (ref 5–15)
BUN: 16 mg/dL (ref 6–20)
CALCIUM: 8.5 mg/dL — AB (ref 8.9–10.3)
CHLORIDE: 107 mmol/L (ref 101–111)
CO2: 28 mmol/L (ref 22–32)
Creatinine, Ser: 1 mg/dL (ref 0.61–1.24)
GFR calc non Af Amer: >60 mL/min (ref >60)
Glucose, Bld: 89 mg/dL (ref 65–99)
Potassium: 3.8 mmol/L (ref 3.5–5.1)
Sodium: 141 mmol/L (ref 135–145)

## 2016-10-29 LAB — CBC
HCT: 45.3 % (ref 40.0–52.0)
HEMOGLOBIN: 15.5 g/dL (ref 13.0–18.0)
MCH: 32.6 pg (ref 26.0–34.0)
MCHC: 34.2 g/dL (ref 32.0–36.0)
MCV: 95.2 fL (ref 80.0–100.0)
Platelets: 245 10*3/uL (ref 150–440)
RBC: 4.76 MIL/uL (ref 4.40–5.90)
RDW: 14 % (ref 11.5–14.5)
WBC: 5.4 10*3/uL (ref 3.8–10.6)

## 2016-10-29 MED ORDER — TRAMADOL HCL 50 MG PO TABS
50.0000 mg | ORAL_TABLET | Freq: Once | ORAL | Status: AC
  Administered 2016-10-29: 50 mg via ORAL
  Filled 2016-10-29: qty 1

## 2016-10-29 MED ORDER — TRAMADOL HCL 50 MG PO TABS: 50.0000 mg | ORAL_TABLET | Freq: Four times a day (QID) | ORAL | 0 refills | Status: AC | PRN

## 2016-10-29 NOTE — ED Triage Notes (Signed)
Pt c/o low back pain and right groin pain

## 2016-10-29 NOTE — ED Provider Notes (Signed)
[facility] Emergency Department Provider Note   ____________________________________________   First MD Initiated Contact with Patient 10/29/16 0451     (approximate)  I have reviewed the triage vital signs and the nursing notes.   HISTORY  Chief Complaint Back Pain and Groin Pain    HPI Kerry KassMatthew Burrill is a 36 y.o. male who comes into the hospital today with lower back pain. He reports that he's had this for a couple of days. He also has had some right-sided groin pain. I'll start her on Friday. The patient has been taking ibuprofen for the pain at home. He denies any pain with urination and denies blood in his urine. He reports it is not the testicle so much as it feels like there is a cord in the air. The patient denies heavy lifting. He has not had any fevers, nausea, vomiting, abdominal pain, chest pain. The patient does have a history of kidney stones but does not think that this is due to a kidney stone. The patient is here today for evaluation. He rates pain a 4 out of 10 in intensity.   Past Medical History:  Diagnosis Date  . Kidney stones     Patient Active Problem List   Diagnosis Date Noted  . Flank pain 10/02/2015  . Renal colic on left side     Past Surgical History:  Procedure Laterality Date  . LEG SURGERY Left     Prior to Admission medications   Medication Sig Start Date End Date Taking? Authorizing Provider  HYDROmorphone (DILAUDID) 2 MG tablet Take 1 tablet (2 mg total) by mouth every 4 (four) hours as needed for severe pain. Take 1-2 tabs 10/02/15   Vanna ScotlandAshley Brandon, MD  tamsulosin (FLOMAX) 0.4 MG CAPS capsule Take 1 capsule (0.4 mg total) by mouth daily. 10/02/15   Vanna ScotlandAshley Brandon, MD  traMADol (ULTRAM) 50 MG tablet Take 1 tablet (50 mg total) by mouth every 6 (six) hours as needed. 10/29/16   Rebecka ApleyAllison P Webster, MD    Allergies Patient has no known allergies.  No family history on file.  Social History Social History    Substance Use Topics  . Smoking status: Current Every Day Smoker  . Smokeless tobacco: Not on file  . Alcohol use No    Review of Systems Constitutional: No fever/chills Eyes: No visual changes. ENT: No sore throat. Cardiovascular: Denies chest pain. Respiratory: Denies shortness of breath. Gastrointestinal: No abdominal pain.  No nausea, no vomiting.  No diarrhea.  No constipation. Genitourinary: Right groin pain Musculoskeletal: back pain. Skin: Negative for rash. Neurological: Negative for headaches, focal weakness or numbness.  10-point ROS otherwise negative.  ____________________________________________   PHYSICAL EXAM:  VITAL SIGNS: ED Triage Vitals  Enc Vitals Group     BP 10/29/16 0213 (!) 147/82     Pulse Rate 10/29/16 0213 93     Resp 10/29/16 0213 20     Temp 10/29/16 0213 97.5 F (36.4 C)     Temp Source 10/29/16 0213 Oral     SpO2 10/29/16 0213 100 %     Weight 10/29/16 0214 165 lb (74.8 kg)     Height 10/29/16 0214 5\' 11"  (1.803 m)     Head Circumference --      Peak Flow --      Pain Score 10/29/16 0214 5     Pain Loc --      Pain Edu? --      Excl. in GC? --  Constitutional: Alert and oriented. Well appearing and in Mild distress. Eyes: Conjunctivae are normal. PERRL. EOMI. Head: Atraumatic. Nose: No congestion/rhinnorhea. Mouth/Throat: Mucous membranes are moist.  Oropharynx non-erythematous. Cardiovascular: Normal rate, regular rhythm. Grossly normal heart sounds.  Good peripheral circulation. Respiratory: Normal respiratory effort.  No retractions. Lungs CTAB. Gastrointestinal: Soft and nontender. No distention. Positive bowel sounds Genitourinary: normal external genitalia with minimal pain to palpation at the inguinal canal Musculoskeletal: No lower extremity tenderness nor edema.   Neurologic:  Normal speech and language.  Skin:  Skin is warm, dry and intact. Psychiatric: Mood and affect are normal.    ____________________________________________   LABS (all labs ordered are listed, but only abnormal results are displayed)  Labs Reviewed  URINALYSIS, COMPLETE (UACMP) WITH MICROSCOPIC - Abnormal; Notable for the following:       Result Value   Color, Urine YELLOW (*)    APPearance CLEAR (*)    Squamous Epithelial / LPF 0-5 (*)    All other components within normal limits  BASIC METABOLIC PANEL - Abnormal; Notable for the following:    Calcium 8.5 (*)    All other components within normal limits  CBC   ____________________________________________  EKG  none ____________________________________________  RADIOLOGY  US scrotum Lumbar spine xray ____________________________________________   PROCEDURES  Procedure(s) performed: None  Procedures  Critical Care performed: No  ____________________________________________   INITIAL IMPRESSION / ASSESSMENT AND PLAN / ED COURSE  Pertinent labs & imaging results that were available during my care of the patient were reviewed by me and considered in my medical decision making (see chart for details).  This is a 36 year old male who comes into the hospital today with some groin pain and low back pain. I will send the patient for a lumbar spine x-ray as well as for an ultrasound of the scrotum. I will give the patient is a tramadol and he'll be reassessed.  Clinical Course as of Oct 29 724  Mon Oct 29, 2016  0610 No acute abnormality.  Mild degenerative disc disease L3-L4. DG Lumbar Spine 2-3 Views [AW]  0706 1. Multiple bilateral epididymal cysts measuring up to 5 mm. Small 8 mm cyst is noted adjacent to the epididymis.  2. Small left hydrocele.  3. Exam is otherwise unremarkable. No evidence of testicular torsion or testicular mass.   US Scrotum [AW]    Clinical Course User Index [AW] Rebecka Apley, MD   The patient's ultrasound shows some epididymal cyst. He'll be discharged home to follow-up with  urology.  ____________________________________________   FINAL CLINICAL IMPRESSION(S) / ED DIAGNOSES  Final diagnoses:  Right inguinal pain  Epididymal cyst  Acute midline low back pain without sciatica      NEW MEDICATIONS STARTED DURING THIS VISIT:  New Prescriptions   TRAMADOL (ULTRAM) 50 MG TABLET    Take 1 tablet (50 mg total) by mouth every 6 (six) hours as needed.     Note:  This document was prepared using Dragon voice recognition software and may include unintentional dictation errors.    Rebecka Apley, MD 10/29/16 [phone]

## 2016-10-29 NOTE — ED Notes (Signed)
Returned from xray

## 2016-10-29 NOTE — ED Notes (Signed)
Pt call light on, spoke with pt, informed he was waiting on results and d/c papers. Pt verbalized understanding.

## 2016-10-29 NOTE — ED Notes (Signed)
Pt informed of why waiting and pt is understanding. Pt offered medication for pain and did not want to take anything at this time.

## 2016-10-29 NOTE — ED Notes (Signed)
Patient returned to room. 

## 2016-10-29 NOTE — ED Notes (Addendum)
Went in to d/c pt.  No one in room at this time.  Pt appears to have eloped.  D/c papers placed in envelope at front desk for pt.

## 2016-10-29 NOTE — ED Notes (Signed)
Patient reports lower right back pain for several day.  No with pain to right testicle/groin area.  Patient denies any type of injury.  Reports history of kidney stones (last approximately 1.5 years ago on the left), but reports this feels different.  Patient denies any type urinary symptoms. No tenderness on palpation.

## 2016-10-29 NOTE — ED Triage Notes (Signed)
Pt ambulatory to triage with no difficulty. Pt reports pain to his mid lower back for about 3 days and pain to his right groin/testicle region. Pt reports "it feels like a swollen tube in there". Pt denies urinary sx or any discharge. Pt reports hx of kidney stones but states this feels nothing like kidney stones.

## 2016-10-29 NOTE — ED Notes (Signed)
Patient transported to Ultrasound 

## 2018-11-20 ENCOUNTER — Emergency Department
Admission: EM | Admit: 2018-11-20 | Discharge: 2018-11-20 | Disposition: A | Discharge status: Other Acute Inpatient Hospital | Payer: Self-pay | Attending: Emergency Medicine | Admitting: Emergency Medicine

## 2018-11-20 ENCOUNTER — Encounter (HOSPITAL_COMMUNITY): Payer: Self-pay | Admitting: Emergency Medicine

## 2018-11-20 ENCOUNTER — Observation Stay (HOSPITAL_COMMUNITY)
Admission: EM | Admit: 2018-11-20 | Discharge: 2018-11-21 | Discharge status: Against Medical Advice | Payer: Self-pay | Attending: Internal Medicine | Admitting: Internal Medicine

## 2018-11-20 ENCOUNTER — Emergency Department: Payer: Self-pay

## 2018-11-20 ENCOUNTER — Encounter: Payer: Self-pay | Admitting: Emergency Medicine

## 2018-11-20 DIAGNOSIS — F17200 Nicotine dependence, unspecified, uncomplicated: Secondary | ICD-10-CM | POA: Insufficient documentation

## 2018-11-20 DIAGNOSIS — S61203A Unspecified open wound of left middle finger without damage to nail, initial encounter: Secondary | ICD-10-CM | POA: Insufficient documentation

## 2018-11-20 DIAGNOSIS — W3189XA Contact with other specified machinery, initial encounter: Secondary | ICD-10-CM | POA: Insufficient documentation

## 2018-11-20 DIAGNOSIS — M659 Synovitis and tenosynovitis, unspecified: Secondary | ICD-10-CM | POA: Insufficient documentation

## 2018-11-20 DIAGNOSIS — Z1629 Resistance to other single specified antibiotic: Secondary | ICD-10-CM | POA: Insufficient documentation

## 2018-11-20 DIAGNOSIS — Z720 Tobacco use: Secondary | ICD-10-CM | POA: Diagnosis present

## 2018-11-20 DIAGNOSIS — Y939 Activity, unspecified: Secondary | ICD-10-CM | POA: Insufficient documentation

## 2018-11-20 DIAGNOSIS — S62623B Displaced fracture of medial phalanx of left middle finger, initial encounter for open fracture: Secondary | ICD-10-CM | POA: Insufficient documentation

## 2018-11-20 DIAGNOSIS — Y999 Unspecified external cause status: Secondary | ICD-10-CM | POA: Insufficient documentation

## 2018-11-20 DIAGNOSIS — Z1623 Resistance to quinolones and fluoroquinolones: Secondary | ICD-10-CM | POA: Insufficient documentation

## 2018-11-20 DIAGNOSIS — Z87442 Personal history of urinary calculi: Secondary | ICD-10-CM | POA: Insufficient documentation

## 2018-11-20 DIAGNOSIS — B9562 Methicillin resistant Staphylococcus aureus infection as the cause of diseases classified elsewhere: Secondary | ICD-10-CM | POA: Insufficient documentation

## 2018-11-20 DIAGNOSIS — Y929 Unspecified place or not applicable: Secondary | ICD-10-CM | POA: Insufficient documentation

## 2018-11-20 DIAGNOSIS — L089 Local infection of the skin and subcutaneous tissue, unspecified: Secondary | ICD-10-CM

## 2018-11-20 DIAGNOSIS — W298XXA Contact with other powered powered hand tools and household machinery, initial encounter: Secondary | ICD-10-CM | POA: Insufficient documentation

## 2018-11-20 DIAGNOSIS — S62613B Displaced fracture of proximal phalanx of left middle finger, initial encounter for open fracture: Principal | ICD-10-CM | POA: Insufficient documentation

## 2018-11-20 LAB — CBC WITH DIFFERENTIAL/PLATELET
ABS IMMATURE GRANULOCYTES: 0.01 10*3/uL (ref 0.00–0.07)
Basophils Absolute: 0 10*3/uL (ref 0.0–0.1)
Basophils Relative: 0 %
Eosinophils Absolute: 0.1 10*3/uL (ref 0.0–0.5)
Eosinophils Relative: 2 %
HCT: 42.9 % (ref 39.0–52.0)
Hemoglobin: 14.2 g/dL (ref 13.0–17.0)
Immature Granulocytes: 0 %
Lymphocytes Relative: 12 %
Lymphs Abs: 0.8 10*3/uL (ref 0.7–4.0)
MCH: 31.6 pg (ref 26.0–34.0)
MCHC: 33.1 g/dL (ref 30.0–36.0)
MCV: 95.3 fL (ref 80.0–100.0)
Monocytes Absolute: 0.7 10*3/uL (ref 0.1–1.0)
Monocytes Relative: 10 %
Neutro Abs: 5.2 10*3/uL (ref 1.7–7.7)
Neutrophils Relative %: 76 %
Platelets: 245 10*3/uL (ref 150–400)
RBC: 4.5 MIL/uL (ref 4.22–5.81)
RDW: 12.7 % (ref 11.5–15.5)
WBC: 6.8 10*3/uL (ref 4.0–10.5)
nRBC: 0 % (ref 0.0–0.2)

## 2018-11-20 LAB — COMPREHENSIVE METABOLIC PANEL
ALBUMIN: 4.7 g/dL (ref 3.5–5.0)
ALT: 24 U/L (ref 0–44)
AST: 27 U/L (ref 15–41)
Alkaline Phosphatase: 73 U/L (ref 38–126)
Anion gap: 7 (ref 5–15)
BUN: 13 mg/dL (ref 6–20)
CO2: 27 mmol/L (ref 22–32)
Calcium: 8.9 mg/dL (ref 8.9–10.3)
Chloride: 106 mmol/L (ref 98–111)
Creatinine, Ser: 0.92 mg/dL (ref 0.61–1.24)
GFR calc Af Amer: >60 mL/min (ref >60)
GFR calc non Af Amer: >60 mL/min (ref >60)
Glucose, Bld: 107 mg/dL — ABNORMAL HIGH (ref 70–99)
Potassium: 3.6 mmol/L (ref 3.5–5.1)
Sodium: 140 mmol/L (ref 135–145)
Total Bilirubin: 0.9 mg/dL (ref 0.3–1.2)
Total Protein: 7 g/dL (ref 6.5–8.1)

## 2018-11-20 LAB — LACTIC ACID, PLASMA: Lactic Acid, Venous: 1.1 mmol/L (ref 0.5–1.9)

## 2018-11-20 MED ORDER — SODIUM CHLORIDE 0.9% FLUSH: 3.0000 mL | Freq: Once | INTRAVENOUS | Status: DC

## 2018-11-20 MED ORDER — VANCOMYCIN HCL IN DEXTROSE 1-5 GM/200ML-% IV SOLN: 1000.0000 mg | Freq: Once | INTRAVENOUS | Status: DC

## 2018-11-20 MED ORDER — SODIUM CHLORIDE 0.9 % IV SOLN
1.0000 g | INTRAVENOUS | Status: DC
  Administered 2018-11-20: 1 g via INTRAVENOUS
  Filled 2018-11-20: qty 10

## 2018-11-20 MED ORDER — OXYCODONE-ACETAMINOPHEN 5-325 MG PO TABS
1.0000 | ORAL_TABLET | ORAL | Status: DC | PRN
  Administered 2018-11-20: 1 via ORAL
  Filled 2018-11-20: qty 1

## 2018-11-20 MED ORDER — MORPHINE SULFATE (PF) 4 MG/ML IV SOLN
4.0000 mg | Freq: Once | INTRAVENOUS | Status: AC
  Administered 2018-11-20: 4 mg via INTRAVENOUS
  Filled 2018-11-20: qty 1

## 2018-11-20 NOTE — ED Notes (Signed)
Spoke with Pam for EMS.

## 2018-11-20 NOTE — ED Provider Notes (Signed)
[facility] EMERGENCY DEPARTMENT Provider Note   CSN: [id number] Arrival date & time: 11/20/18  2332     History   Chief Complaint Chief Complaint  Patient presents with  . Finger Injury    HPI Tyler Rodriguez is a 38 y.o. male.  Patient is a 38 year old male with no significant past medical history.  He presents with complaints of pain, swelling, drainage coming from his left middle finger.  He states that he was working with an angle grinder 2 weeks ago when he inadvertently cut his finger.  He has been trying to treat this at home by himself.  2 days ago it began to swell, developed redness and purulent drainage.  He was seen at [facility], given antibiotics, then referred here to see hand surgery.  The history is provided by the patient.    Past Medical History:  Diagnosis Date  . Kidney stones     Patient Active Problem List   Diagnosis Date Noted  . Flank pain 10/02/2015  . Renal colic on left side     Past Surgical History:  Procedure Laterality Date  . LEG SURGERY Left         Home Medications    Prior to Admission medications   Medication Sig Start Date End Date Taking? Authorizing Provider  HYDROmorphone (DILAUDID) 2 MG tablet Take 1 tablet (2 mg total) by mouth every 4 (four) hours as needed for severe pain. Take 1-2 tabs 10/02/15   Vanna Scotland, MD  tamsulosin (FLOMAX) 0.4 MG CAPS capsule Take 1 capsule (0.4 mg total) by mouth daily. 10/02/15   Vanna Scotland, MD  traMADol (ULTRAM) 50 MG tablet Take 1 tablet (50 mg total) by mouth every 6 (six) hours as needed. 10/29/16   Rebecka Apley, MD    Family History History reviewed. No pertinent family history.  Social History Social History   Tobacco Use  . Smoking status: Current Every Day Smoker  . Smokeless tobacco: Never Used  Substance Use Topics  . Alcohol use: No  . Drug use: No     Allergies   Patient has no known allergies.   Review of Systems Review of Systems    All other systems reviewed and are negative.    Physical Exam Updated Vital Signs BP (!) 112/91 (BP Location: Right Arm)   Pulse 87   Temp 98.4 F (36.9 C) (Oral)   Resp 17   Ht 5\' 10"  (1.778 m)   Wt 77.1 kg   SpO2 99%   BMI 24.39 kg/m   Physical Exam Vitals signs and nursing note reviewed.  Constitutional:      Appearance: Normal appearance.  HENT:     Head: Normocephalic and atraumatic.  Neck:     Musculoskeletal: Normal range of motion.  Pulmonary:     Effort: Pulmonary effort is normal.  Musculoskeletal:     Comments: The left middle finger is markedly swollen.  There is purulent drainage and erythema.  He has pain with range of motion.  Skin:    General: Skin is warm and dry.  Neurological:     Mental Status: He is alert.      ED Treatments / Results  Labs (all labs ordered are listed, but only abnormal results are displayed) Labs Reviewed - No data to display  EKG None  Radiology Dg Finger Middle Left  Result Date: 11/20/2018 CLINICAL DATA:  38 year old male status post injury to middle finger on angle grinder 1 week ago but  abrupt onset swelling and redness yesterday. EXAM: LEFT MIDDLE FINGER 2+V COMPARISON:  None. FINDINGS: Moderate to severe soft tissue swelling. No soft tissue gas identified, but dorsal to the left 3rd PIP there are multiple 2-3 millimeter irregular radiopaque foreign bodies. Given their density, to possible 1 or more of these might be tiny bone fragments, although no donor site is identified off of the dorsal left 3rd proximal or middle phalanges. The 3rd PIP and DIP appear aligned and normal. Other visible osseous structures appear intact. There are healed chronic deformities of the distal left 4th and 5th metacarpals. IMPRESSION: 1. Moderate to severe soft tissue swelling at the left 3rd PIP with multiple 2-3 mm irregular radiopaque foreign bodies, it is unclear whether any of these might be tiny bone fragments, but no donor site is  identified along the dorsal 3rd proximal or middle phalanges. 2. Third finger joint spaces appear normal.  No dislocation. Electronically Signed   By: Odessa Fleming M.D.   On: 11/20/2018 18:08    Procedures Procedures (including critical care time)  Medications Ordered in ED Medications - No data to display   Initial Impression / Assessment and Plan / ED Course  I have reviewed the triage vital signs and the nursing notes.  Pertinent labs & imaging results that were available during my care of the patient were reviewed by me and considered in my medical decision making (see chart for details).  Patient with infected finger after cutting himself with an angle grinder.  He was sent here from [place] to see hand surgery.  Dr. Merlyn Lot has evaluated the patient and will take him to the operating room for debridement/washout.  Patient will be admitted to the hospitalist service for repeat antibiotics.  Dr. Clyde Lundborg agrees to admit.  Final Clinical Impressions(s) / ED Diagnoses   Final diagnoses:  None    ED Discharge Orders    None       Geoffery Lyons, MD 11/21/18 0104

## 2018-11-20 NOTE — ED Provider Notes (Signed)
[facility] Emergency Department Provider Note ____________________________________________   First MD Initiated Contact with Patient 11/20/18 1956     (approximate)  I have reviewed the triage vital signs and the nursing notes.   HISTORY  Chief Complaint Finger Injury    HPI Tyler Rodriguez is a 38 y.o. male with PMH as noted below who presents with left third digit swelling and pain, gradual onset over the last several days, persistent course, and occurring after trauma that happened 2 weeks ago.  The patient states that about 2 weeks ago he cut the dorsal aspect of the finger with an angle grinder.  He states that it was healing well initially, but then several days ago started to become more painful and swollen, now with redness spreading up the hand.  He denies fever or chills but does report a funny feeling going up the left arm.  Past Medical History:  Diagnosis Date  . Kidney stones     Patient Active Problem List   Diagnosis Date Noted  . Flank pain 10/02/2015  . Renal colic on left side     Past Surgical History:  Procedure Laterality Date  . LEG SURGERY Left     Prior to Admission medications   Medication Sig Start Date End Date Taking? Authorizing Provider  HYDROmorphone (DILAUDID) 2 MG tablet Take 1 tablet (2 mg total) by mouth every 4 (four) hours as needed for severe pain. Take 1-2 tabs 10/02/15   Vanna Scotland, MD  tamsulosin (FLOMAX) 0.4 MG CAPS capsule Take 1 capsule (0.4 mg total) by mouth daily. 10/02/15   Vanna Scotland, MD  traMADol (ULTRAM) 50 MG tablet Take 1 tablet (50 mg total) by mouth every 6 (six) hours as needed. 10/29/16   Rebecka Apley, MD    Allergies Patient has no known allergies.  No family history on file.  Social History Social History   Tobacco Use  . Smoking status: Current Every Day Smoker  . Smokeless tobacco: Never Used  Substance Use Topics  . Alcohol use: No  . Drug use: No    Review  of Systems  Constitutional: No fever. Eyes: No redness. ENT: No neck pain. Cardiovascular: Denies chest pain. Respiratory: Denies shortness of breath. Gastrointestinal: No vomiting. Genitourinary: Negative for flank pain.  Musculoskeletal: Positive for left third digit swelling. Skin: Positive for rash. Neurological: Negative for focal weakness or numbness.   ____________________________________________   PHYSICAL EXAM:  VITAL SIGNS: ED Triage Vitals  Enc Vitals Group     BP 11/20/18 1715 (!) 154/89     Pulse Rate 11/20/18 1715 (!) 101     Resp 11/20/18 1715 20     Temp 11/20/18 1715 98.3 F (36.8 C)     Temp Source 11/20/18 1715 Oral     SpO2 11/20/18 1715 97 %     Weight 11/20/18 1716 170 lb (77.1 kg)     Height 11/20/18 1716 5\' 11"  (1.803 m)     Head Circumference --      Peak Flow --      Pain Score 11/20/18 1718 8     Pain Loc --      Pain Edu? --      Excl. in GC? --     Constitutional: Alert and oriented. Well appearing and in no acute distress. Eyes: Conjunctivae are normal.  Head: Atraumatic. Nose: No congestion/rhinnorhea. Mouth/Throat: Mucous membranes are moist.   Neck: Normal range of motion.  Cardiovascular:  Good peripheral circulation. Respiratory:  Normal respiratory effort.  Gastrointestinal: No distention.  Musculoskeletal: Extremities warm and well perfused.  Left third digit with significant swelling from the proximal to middle phalanx and over the PIP joint.  Approximately 4 cm wound dorsally over the PIP joint with some oozing of clear liquid.  Erythema, induration and warmth over the finger and up to the dorsal aspect of the hand. Neurologic:  Normal speech and language. No gross focal neurologic deficits are appreciated.  Skin:  Skin is warm and dry.  Psychiatric: Mood and affect are normal. Speech and behavior are normal.  ____________________________________________   LABS (all labs ordered are listed, but only abnormal results are  displayed)  Labs Reviewed  COMPREHENSIVE METABOLIC PANEL - Abnormal; Notable for the following components:      Result Value   Glucose, Bld 107 (*)    All other components within normal limits  LACTIC ACID, PLASMA  CBC WITH DIFFERENTIAL/PLATELET   ____________________________________________  EKG   ____________________________________________  RADIOLOGY  XR L hand: Soft tissue swelling of the third digit with no fracture  ____________________________________________   PROCEDURES  Procedure(s) performed: No  Procedures  Critical Care performed: No ____________________________________________   INITIAL IMPRESSION / ASSESSMENT AND PLAN / ED COURSE  Pertinent labs & imaging results that were available during my care of the patient were reviewed by me and considered in my medical decision making (see chart for details).  38 year old male with PMH as noted above presents with worsening pain and swelling from a wound over the PIP joint of his left third finger.  The initial injury was 2 weeks ago and the patient states it was healing well until the last several days when it started to swell.  He is unable to bend it much.  On exam he has significant swelling of that finger especially over the extensor surfaces, and an open wound with some oozing.  There is erythema and induration to the dorsum of the hand as well.  Presentation is consistent with an extensor tenosynovitis with cellulitis extending to the hand.  The patient will need IV antibiotics and hand consultation for likely debridement.  I have contacted the transfer [facility].  ----------------------------------------- 10:26 PM on 11/20/2018 -----------------------------------------  I consulted with Dr. Merlyn LotKuzma from the hand service at [facility].  He agreed to evaluate the patient in the ED there.  I contacted Dr. Particia NearingHaviland from the ED who agreed to accept the patient.  The patient is stable for transfer at  this time.  ____________________________________________   FINAL CLINICAL IMPRESSION(S) / ED DIAGNOSES  Final diagnoses:  Tenosynovitis      NEW MEDICATIONS STARTED DURING THIS VISIT:  New Prescriptions   No medications on file     Note:  This document was prepared using Dragon voice recognition software and may include unintentional dictation errors.    Dionne BucySiadecki, Kamry Faraci, MD 11/20/18 2227

## 2018-11-20 NOTE — ED Notes (Signed)
EMTALA checked by This RN

## 2018-11-20 NOTE — ED Triage Notes (Signed)
  Patient comes from [facility] with finger injury that occurred two weeks ago.  Patient states he accidentally cut it with an angle grinder.  Patient states it was fine until today and started swelling more and becoming more painful.  Middle finger on L hand, tripled in size and red/swollen.  Pain 8/10.  Patient sent for hand surgery consult.  Patient A&O x4.

## 2018-11-20 NOTE — ED Notes (Signed)
Multiple attempts to give report to [facility] ED, unable to get in contact.

## 2018-11-20 NOTE — ED Triage Notes (Signed)
Patient states he caught his middle finger of his left hand with an angle grider approx. 1 week ago.  Patient states, "it was fine until yesterday, but it started swelling getting really red.  Finger is reddened and swollen with redness expanding past patient's left wrist.  Left hand appears very warm.  Skin is open on middle finger.

## 2018-11-21 ENCOUNTER — Other Ambulatory Visit: Payer: Self-pay

## 2018-11-21 ENCOUNTER — Observation Stay (HOSPITAL_COMMUNITY): Payer: Self-pay | Admitting: Anesthesiology

## 2018-11-21 ENCOUNTER — Encounter (HOSPITAL_COMMUNITY)
Admission: EM | Discharge status: Against Medical Advice | Payer: Self-pay | Source: Home / Self Care | Attending: Emergency Medicine

## 2018-11-21 ENCOUNTER — Encounter (HOSPITAL_COMMUNITY): Payer: Self-pay | Admitting: Anesthesiology

## 2018-11-21 DIAGNOSIS — Z720 Tobacco use: Secondary | ICD-10-CM

## 2018-11-21 DIAGNOSIS — L089 Local infection of the skin and subcutaneous tissue, unspecified: Secondary | ICD-10-CM

## 2018-11-21 DIAGNOSIS — M659 Synovitis and tenosynovitis, unspecified: Secondary | ICD-10-CM | POA: Diagnosis present

## 2018-11-21 HISTORY — PX: I & D EXTREMITY: SHX5045

## 2018-11-21 LAB — CBC
HCT: 41.7 % (ref 39.0–52.0)
Hemoglobin: 13.4 g/dL (ref 13.0–17.0)
MCH: 31.5 pg (ref 26.0–34.0)
MCHC: 32.1 g/dL (ref 30.0–36.0)
MCV: 97.9 fL (ref 80.0–100.0)
Platelets: 181 10*3/uL (ref 150–400)
RBC: 4.26 MIL/uL (ref 4.22–5.81)
RDW: 12.8 % (ref 11.5–15.5)
WBC: 5.6 10*3/uL (ref 4.0–10.5)
nRBC: 0 % (ref 0.0–0.2)

## 2018-11-21 LAB — BASIC METABOLIC PANEL
Anion gap: 12 (ref 5–15)
BUN: 9 mg/dL (ref 6–20)
CO2: 25 mmol/L (ref 22–32)
Calcium: 8.4 mg/dL — ABNORMAL LOW (ref 8.9–10.3)
Chloride: 103 mmol/L (ref 98–111)
Creatinine, Ser: 1.03 mg/dL (ref 0.61–1.24)
GFR calc Af Amer: >60 mL/min (ref >60)
GFR calc non Af Amer: >60 mL/min (ref >60)
Glucose, Bld: 96 mg/dL (ref 70–99)
Potassium: 3.5 mmol/L (ref 3.5–5.1)
Sodium: 140 mmol/L (ref 135–145)

## 2018-11-21 LAB — APTT: aPTT: 30 seconds (ref 24–36)

## 2018-11-21 LAB — PROTIME-INR
INR: 1.17
Prothrombin Time: 14.8 seconds (ref 11.4–15.2)

## 2018-11-21 LAB — HIV ANTIBODY (ROUTINE TESTING W REFLEX): HIV Screen 4th Generation wRfx: NONREACTIVE

## 2018-11-21 LAB — SEDIMENTATION RATE: Sed Rate: 5 mm/hr (ref 0–16)

## 2018-11-21 SURGERY — IRRIGATION AND DEBRIDEMENT EXTREMITY
Anesthesia: General | Site: Finger | Laterality: Left

## 2018-11-21 MED ORDER — MORPHINE SULFATE (PF) 4 MG/ML IV SOLN
4.0000 mg | Freq: Once | INTRAVENOUS | Status: AC
  Administered 2018-11-21: 4 mg via INTRAVENOUS
  Filled 2018-11-21: qty 1

## 2018-11-21 MED ORDER — LACTATED RINGERS IV SOLN
INTRAVENOUS | Status: DC | PRN
  Administered 2018-11-21: 03:00:00 via INTRAVENOUS

## 2018-11-21 MED ORDER — LIDOCAINE HCL (CARDIAC) PF 100 MG/5ML IV SOSY
PREFILLED_SYRINGE | INTRAVENOUS | Status: DC | PRN
  Administered 2018-11-21: 60 mg via INTRATRACHEAL

## 2018-11-21 MED ORDER — ACETAMINOPHEN 325 MG PO TABS: 650.0000 mg | ORAL_TABLET | Freq: Four times a day (QID) | ORAL | Status: DC | PRN

## 2018-11-21 MED ORDER — FENTANYL CITRATE (PF) 250 MCG/5ML IJ SOLN
INTRAMUSCULAR | Status: DC | PRN
  Administered 2018-11-21 (×2): 50 ug via INTRAVENOUS

## 2018-11-21 MED ORDER — FENTANYL CITRATE (PF) 250 MCG/5ML IJ SOLN
INTRAMUSCULAR | Status: AC
  Filled 2018-11-21: qty 5

## 2018-11-21 MED ORDER — 0.9 % SODIUM CHLORIDE (POUR BTL) OPTIME
TOPICAL | Status: DC | PRN
  Administered 2018-11-21: 1000 mL

## 2018-11-21 MED ORDER — ONDANSETRON HCL 4 MG/2ML IJ SOLN: 4.0000 mg | Freq: Three times a day (TID) | INTRAMUSCULAR | Status: DC | PRN

## 2018-11-21 MED ORDER — VANCOMYCIN HCL 10 G IV SOLR
1250.0000 mg | Freq: Two times a day (BID) | INTRAVENOUS | Status: DC
  Filled 2018-11-21: qty 1250

## 2018-11-21 MED ORDER — ONDANSETRON HCL 4 MG/2ML IJ SOLN
INTRAMUSCULAR | Status: DC | PRN
  Administered 2018-11-21: 4 mg via INTRAVENOUS

## 2018-11-21 MED ORDER — NICOTINE 21 MG/24HR TD PT24: 21.0000 mg | MEDICATED_PATCH | Freq: Every day | TRANSDERMAL | Status: DC

## 2018-11-21 MED ORDER — ACETAMINOPHEN 650 MG RE SUPP: 650.0000 mg | Freq: Four times a day (QID) | RECTAL | Status: DC | PRN

## 2018-11-21 MED ORDER — ONDANSETRON HCL 4 MG/2ML IJ SOLN
4.0000 mg | Freq: Once | INTRAMUSCULAR | Status: AC
  Administered 2018-11-21: 4 mg via INTRAVENOUS
  Filled 2018-11-21: qty 2

## 2018-11-21 MED ORDER — ENOXAPARIN SODIUM 40 MG/0.4ML ~~LOC~~ SOLN: 40.0000 mg | SUBCUTANEOUS | Status: DC

## 2018-11-21 MED ORDER — HYDROMORPHONE HCL 1 MG/ML IJ SOLN: 1.0000 mg | INTRAMUSCULAR | Status: DC | PRN

## 2018-11-21 MED ORDER — OXYCODONE-ACETAMINOPHEN 5-325 MG PO TABS
1.0000 | ORAL_TABLET | ORAL | Status: DC | PRN
  Administered 2018-11-21: 1 via ORAL
  Filled 2018-11-21: qty 1

## 2018-11-21 MED ORDER — MORPHINE SULFATE (PF) 2 MG/ML IV SOLN: 2.0000 mg | INTRAVENOUS | Status: DC | PRN

## 2018-11-21 MED ORDER — MIDAZOLAM HCL 5 MG/5ML IJ SOLN
INTRAMUSCULAR | Status: DC | PRN
  Administered 2018-11-21: 2 mg via INTRAVENOUS

## 2018-11-21 MED ORDER — MIDAZOLAM HCL 2 MG/2ML IJ SOLN
INTRAMUSCULAR | Status: AC
  Filled 2018-11-21: qty 2

## 2018-11-21 MED ORDER — BUPIVACAINE HCL (PF) 0.25 % IJ SOLN
INTRAMUSCULAR | Status: AC
  Filled 2018-11-21: qty 30

## 2018-11-21 MED ORDER — SODIUM CHLORIDE 0.9 % IR SOLN
Status: DC | PRN
  Administered 2018-11-21: 3000 mL

## 2018-11-21 MED ORDER — ZOLPIDEM TARTRATE 5 MG PO TABS: 5.0000 mg | ORAL_TABLET | Freq: Every evening | ORAL | Status: DC | PRN

## 2018-11-21 MED ORDER — LACTATED RINGERS IV SOLN: INTRAVENOUS | Status: DC

## 2018-11-21 MED ORDER — PROPOFOL 10 MG/ML IV BOLUS
INTRAVENOUS | Status: AC
  Filled 2018-11-21: qty 40

## 2018-11-21 MED ORDER — SENNOSIDES-DOCUSATE SODIUM 8.6-50 MG PO TABS: 1.0000 | ORAL_TABLET | Freq: Every evening | ORAL | Status: DC | PRN

## 2018-11-21 MED ORDER — MEPERIDINE HCL 50 MG/ML IJ SOLN: 6.2500 mg | INTRAMUSCULAR | Status: DC | PRN

## 2018-11-21 MED ORDER — VANCOMYCIN HCL 10 G IV SOLR
1500.0000 mg | Freq: Once | INTRAVENOUS | Status: DC
  Filled 2018-11-21: qty 1500

## 2018-11-21 MED ORDER — FENTANYL CITRATE (PF) 100 MCG/2ML IJ SOLN: 25.0000 ug | INTRAMUSCULAR | Status: DC | PRN

## 2018-11-21 MED ORDER — PROMETHAZINE HCL 25 MG/ML IJ SOLN: 6.2500 mg | INTRAMUSCULAR | Status: DC | PRN

## 2018-11-21 MED ORDER — VANCOMYCIN HCL IN DEXTROSE 1-5 GM/200ML-% IV SOLN
INTRAVENOUS | Status: AC
  Filled 2018-11-21: qty 200

## 2018-11-21 MED ORDER — MIDAZOLAM HCL 2 MG/2ML IJ SOLN: 0.5000 mg | Freq: Once | INTRAMUSCULAR | Status: DC | PRN

## 2018-11-21 MED ORDER — BUPIVACAINE HCL (PF) 0.25 % IJ SOLN
INTRAMUSCULAR | Status: DC | PRN
  Administered 2018-11-21: 10 mL

## 2018-11-21 MED ORDER — VITAMIN C 500 MG PO TABS: 1000.0000 mg | ORAL_TABLET | Freq: Every day | ORAL | Status: DC

## 2018-11-21 MED ORDER — PROPOFOL 10 MG/ML IV BOLUS
INTRAVENOUS | Status: DC | PRN
  Administered 2018-11-21: 200 mg via INTRAVENOUS
  Administered 2018-11-21: 40 mg via INTRAVENOUS

## 2018-11-21 MED ORDER — VANCOMYCIN HCL 1000 MG IV SOLR
INTRAVENOUS | Status: DC | PRN
  Administered 2018-11-21: 1000 mg via INTRAVENOUS

## 2018-11-21 SURGICAL SUPPLY — 54 items
BANDAGE ACE 3X5.8 VEL STRL LF (GAUZE/BANDAGES/DRESSINGS) ×3 IMPLANT
BANDAGE ACE 4X5 VEL STRL LF (GAUZE/BANDAGES/DRESSINGS) ×3 IMPLANT
BANDAGE COBAN STERILE 2 (GAUZE/BANDAGES/DRESSINGS) IMPLANT
BNDG COHESIVE 1X5 TAN STRL LF (GAUZE/BANDAGES/DRESSINGS) ×2 IMPLANT
BNDG ESMARK 4X9 LF (GAUZE/BANDAGES/DRESSINGS) IMPLANT
BNDG GAUZE ELAST 4 BULKY (GAUZE/BANDAGES/DRESSINGS) ×3 IMPLANT
CORDS BIPOLAR (ELECTRODE) ×3 IMPLANT
COVER SURGICAL LIGHT HANDLE (MISCELLANEOUS) ×3 IMPLANT
COVER WAND RF STERILE (DRAPES) ×3 IMPLANT
CUFF TOURNIQUET SINGLE 18IN (TOURNIQUET CUFF) ×2 IMPLANT
CUFF TOURNIQUET SINGLE 24IN (TOURNIQUET CUFF) IMPLANT
DECANTER SPIKE VIAL GLASS SM (MISCELLANEOUS) ×3 IMPLANT
DRAIN PENROSE 1/4X12 LTX STRL (WOUND CARE) IMPLANT
DRSG PAD ABDOMINAL 8X10 ST (GAUZE/BANDAGES/DRESSINGS) ×4 IMPLANT
GAUZE PACKING IODOFORM 1/4X15 (GAUZE/BANDAGES/DRESSINGS) ×2 IMPLANT
GAUZE SPONGE 4X4 12PLY STRL (GAUZE/BANDAGES/DRESSINGS) ×3 IMPLANT
GAUZE XEROFORM 1X8 LF (GAUZE/BANDAGES/DRESSINGS) ×3 IMPLANT
GLOVE BIO SURGEON STRL SZ7.5 (GLOVE) ×3 IMPLANT
GLOVE BIOGEL PI IND STRL 8 (GLOVE) ×1 IMPLANT
GLOVE BIOGEL PI INDICATOR 8 (GLOVE) ×2
GOWN STRL REUS W/ TWL LRG LVL3 (GOWN DISPOSABLE) ×1 IMPLANT
GOWN STRL REUS W/ TWL XL LVL3 (GOWN DISPOSABLE) ×1 IMPLANT
GOWN STRL REUS W/TWL LRG LVL3 (GOWN DISPOSABLE) ×2
GOWN STRL REUS W/TWL XL LVL3 (GOWN DISPOSABLE) ×2
KIT BASIN OR (CUSTOM PROCEDURE TRAY) ×3 IMPLANT
KIT TURNOVER KIT B (KITS) ×3 IMPLANT
LOOP VESSEL MAXI BLUE (MISCELLANEOUS) IMPLANT
MANIFOLD NEPTUNE II (INSTRUMENTS) IMPLANT
NDL HYPO 25X1 1.5 SAFETY (NEEDLE) IMPLANT
NEEDLE HYPO 25X1 1.5 SAFETY (NEEDLE) IMPLANT
NS IRRIG 1000ML POUR BTL (IV SOLUTION) ×3 IMPLANT
PACK ORTHO EXTREMITY (CUSTOM PROCEDURE TRAY) ×3 IMPLANT
PAD ARMBOARD 7.5X6 YLW CONV (MISCELLANEOUS) ×6 IMPLANT
PAD CAST 3X4 CTTN HI CHSV (CAST SUPPLIES) IMPLANT
PADDING CAST COTTON 3X4 STRL (CAST SUPPLIES) ×2
SCRUB BETADINE 4OZ XXX (MISCELLANEOUS) ×3 IMPLANT
SET CYSTO W/LG BORE CLAMP LF (SET/KITS/TRAYS/PACK) ×4 IMPLANT
SLING ARM FOAM STRAP LRG (SOFTGOODS) ×2 IMPLANT
SOL PREP POV-IOD 4OZ 10% (MISCELLANEOUS) ×3 IMPLANT
SPLINT PLASTER CAST XFAST 3X15 (CAST SUPPLIES) IMPLANT
SPLINT PLASTER XTRA FASTSET 3X (CAST SUPPLIES) ×6
SPONGE LAP 4X18 RFD (DISPOSABLE) ×3 IMPLANT
SUT ETHILON 4 0 P 3 18 (SUTURE) IMPLANT
SUT ETHILON 4 0 PS 2 18 (SUTURE) ×2 IMPLANT
SUT MON AB 5-0 P3 18 (SUTURE) IMPLANT
SWAB COLLECTION DEVICE MRSA (MISCELLANEOUS) ×2 IMPLANT
SWAB CULTURE ESWAB REG 1ML (MISCELLANEOUS) ×2 IMPLANT
SYR CONTROL 10ML LL (SYRINGE) IMPLANT
TOWEL OR 17X26 10 PK STRL BLUE (TOWEL DISPOSABLE) ×3 IMPLANT
TUBE CONNECTING 12'X1/4 (SUCTIONS) ×1
TUBE CONNECTING 12X1/4 (SUCTIONS) ×2 IMPLANT
TUBE FEEDING ENTERAL 5FR 16IN (TUBING) IMPLANT
UNDERPAD 30X30 (UNDERPADS AND DIAPERS) ×3 IMPLANT
YANKAUER SUCT BULB TIP NO VENT (SUCTIONS) ×3 IMPLANT

## 2018-11-21 NOTE — Progress Notes (Signed)
Pt came back to unit accompanied by girlfriend at approximately 17:15. Charge RN Selena Batten), ADON Jeani Hawking), [place] and this RN went to Pt's room, informed them that they were not allowed to return to the room and to start process of readmission through ED. Security present during the event, security recommended that if they dispute their time of absence, we could contact Orlene Plum. MD notified. Saline locked PIV removed with catheter clean/dry/intact,. Pt started unwrapping the ace wrap and was trying to remove gauze, educated Pt and girlfriend that it is not recommended to remove the dressing and that he needs to see the surgeon to get dressing changed. He said he already has an appointment with MD at 11:00 on Monday 11/24/2018. Pt left unit with all belongings accompanied by girlfriend.

## 2018-11-21 NOTE — H&P (Signed)
History and Physical    Tyler Rodriguez SWF:[id number] DOB: 1981/04/01 DOA: 11/20/2018  Referring MD/NP/PA:   PCP: Patient, No Pcp Per   Patient coming from:  The patient is coming from home.  At baseline, pt is independent for most of ADL.        Chief Complaint: left hand pain  HPI: Tyler Rodriguez is a 38 y.o. male with medical history significant of kidney stone, tobacco abuse, who presents with left hand pain.  Pt states that he cut his left middle finger accidentally by an angle grinder 2 weeks ago. He states that initially he feel fine and it seems have been healing, but 2 days ago His left middle finger started to become more painful and swollen.  It has been progressively worsening.  The redness has been spreading up to hand. He noted some pus draining from the cut. The pain is constant, sharp, 8 out of 10 severity, nonradiating, aggravated by moving finger.  Patient denies fever or chills.  No nausea vomiting, diarrhea or abdominal pain.  No chest pain, shortness breath or cough. Patient was [facility] seen in [facility], and transferred to [facility] hospital due to the need of hand surgeon consultation. Pt was given IV Rocephin in [facility].  ED Course: pt was found to have WBC 6.8, lactic acid 1.1, electrolytes renal function okay, temperature normal, slightly tachycardia, oxygen saturation 98% on room air.  Patient is placed on MedSurg [place] for observation.  Hand surgeon, Dr. Maryan Rued was consulted.  X-ray showed: 1. Moderate to severe soft tissue swelling at the left 3rd PIP with multiple 2-3 mm irregular radiopaque foreign bodies, it is unclear whether any of these might be tiny bone fragments, but no donor site is identified along the dorsal 3rd proximal or middle phalanges. 2. Third finger joint spaces appear normal.  No dislocation.  Review of Systems:   General: no fevers, chills, no body weight gain, has poor appetite, has fatigue HEENT: no blurry  vision, hearing changes or sore throat Respiratory: no dyspnea, coughing, wheezing CV: no chest pain, no palpitations GI: no nausea, vomiting, abdominal pain, diarrhea, constipation GU: no dysuria, burning on urination, increased urinary frequency, hematuria  Ext: no leg edema Neuro: no unilateral weakness, numbness, or tingling, no vision change or hearing loss Skin: no rash. Has left hand pain with an open wound in the left middle finger. MSK: No muscle spasm, no deformity, no limitation of range of movement in spin Heme: No easy bruising.  Travel history: No recent long distant travel.  Allergy: No Known Allergies  Past Medical History:  Diagnosis Date  . Kidney stones     Past Surgical History:  Procedure Laterality Date  . LEG SURGERY Left     Social History:  reports that he has been smoking. He has never used smokeless tobacco. He reports that he does not drink alcohol or use drugs.  Family History: Reviewed with patient, patient states that all family members are healthy.  Prior to Admission medications   Medication Sig Start Date End Date Taking? Authorizing Provider  HYDROmorphone (DILAUDID) 2 MG tablet Take 1 tablet (2 mg total) by mouth every 4 (four) hours as needed for severe pain. Take 1-2 tabs 10/02/15   Hollice Espy, MD  tamsulosin (FLOMAX) 0.4 MG CAPS capsule Take 1 capsule (0.4 mg total) by mouth daily. 10/02/15   Hollice Espy, MD  traMADol (ULTRAM) 50 MG tablet Take 1 tablet (50 mg total) by mouth every 6 (six)  hours as needed. 10/29/16   Loney Hering, MD    Physical Exam: Vitals:   11/20/18 2341 11/20/18 2345 11/21/18 0000 11/21/18 0015  BP:  100/81 110/72 112/81  Pulse:  93 89 81  Resp:      Temp:      TempSrc:      SpO2:  100% 100% 98%  Weight: 77.1 kg     Height: 5' 10"  (1.778 m)      General: Not in acute distress HEENT:       Eyes: PERRL, EOMI, no scleral icterus.       ENT: No discharge from the ears and nose, no pharynx  injection, no tonsillar enlargement.        Neck: No JVD, no bruit, no mass felt. Heme: No neck lymph node enlargement. Cardiac: S1/S2, RRR, No murmurs, No gallops or rubs. Respiratory: Good air movement bilaterally. No rales, wheezing, rhonchi or rubs. GI: Soft, nondistended, nontender, no rebound pain, no organomegaly, BS present. GU: No hematuria Ext: No pitting leg edema bilaterally. 2+DP/PT pulse bilaterally. Musculoskeletal: No joint deformities, No joint redness or warmth, no limitation of ROM in spin. Skin: has an open wound in the dorsal aspect of left middle finger over PIP joint. The left middle finger has significant swelling, erythema, warmth and tenderness, spreading up to the dorsal aspect of the hand, has some serosanguineous liquid draining from the wound. Neuro: Alert, oriented X3, cranial nerves II-XII grossly intact, moves all extremities normally.  Psych: Patient is not psychotic, no suicidal or hemocidal ideation.  Labs on Admission: I have personally reviewed following labs and imaging studies  CBC: Recent Labs  Lab 11/20/18 1726  WBC 6.8  NEUTROABS 5.2  HGB 14.2  HCT 42.9  MCV 95.3  PLT 161   Basic Metabolic Panel: Recent Labs  Lab 11/20/18 1726  NA 140  K 3.6  CL 106  CO2 27  GLUCOSE 107*  BUN 13  CREATININE 0.92  CALCIUM 8.9   GFR: Estimated Creatinine Clearance: 112.4 mL/min (by C-G formula based on SCr of 0.92 mg/dL). Liver Function Tests: Recent Labs  Lab 11/20/18 1726  AST 27  ALT 24  ALKPHOS 73  BILITOT 0.9  PROT 7.0  ALBUMIN 4.7   No results for input(s): LIPASE, AMYLASE in the last 168 hours. No results for input(s): AMMONIA in the last 168 hours. Coagulation Profile: No results for input(s): INR, PROTIME in the last 168 hours. Cardiac Enzymes: No results for input(s): CKTOTAL, CKMB, CKMBINDEX, TROPONINI in the last 168 hours. BNP (last 3 results) No results for input(s): PROBNP in the last 8760 hours. HbA1C: No results for  input(s): HGBA1C in the last 72 hours. CBG: No results for input(s): GLUCAP in the last 168 hours. Lipid Profile: No results for input(s): CHOL, HDL, LDLCALC, TRIG, CHOLHDL, LDLDIRECT in the last 72 hours. Thyroid Function Tests: No results for input(s): TSH, T4TOTAL, FREET4, T3FREE, THYROIDAB in the last 72 hours. Anemia Panel: No results for input(s): VITAMINB12, FOLATE, FERRITIN, TIBC, IRON, RETICCTPCT in the last 72 hours. Urine analysis:    Component Value Date/Time   COLORURINE YELLOW (A) 10/29/2016 0222   APPEARANCEUR CLEAR (A) 10/29/2016 0222   LABSPEC 1.024 10/29/2016 0222   PHURINE 5.0 10/29/2016 0222   GLUCOSEU NEGATIVE 10/29/2016 0222   HGBUR NEGATIVE 10/29/2016 0222   BILIRUBINUR NEGATIVE 10/29/2016 0222   KETONESUR NEGATIVE 10/29/2016 0222   PROTEINUR NEGATIVE 10/29/2016 0222   NITRITE NEGATIVE 10/29/2016 0222   LEUKOCYTESUR NEGATIVE 10/29/2016 0222  Sepsis Labs: @LABRCNTIP (procalcitonin:4,lacticidven:4) )No results found for this or any previous visit (from the past 240 hour(s)).   Radiological Exams on Admission: Dg Finger Middle Left  Result Date: 11/20/2018 CLINICAL DATA:  38 year old male status post injury to middle finger on angle grinder 1 week ago but abrupt onset swelling and redness yesterday. EXAM: LEFT MIDDLE FINGER 2+V COMPARISON:  None. FINDINGS: Moderate to severe soft tissue swelling. No soft tissue gas identified, but dorsal to the left 3rd PIP there are multiple 2-3 millimeter irregular radiopaque foreign bodies. Given their density, to possible 1 or more of these might be tiny bone fragments, although no donor site is identified off of the dorsal left 3rd proximal or middle phalanges. The 3rd PIP and DIP appear aligned and normal. Other visible osseous structures appear intact. There are healed chronic deformities of the distal left 4th and 5th metacarpals. IMPRESSION: 1. Moderate to severe soft tissue swelling at the left 3rd PIP with multiple 2-3 mm  irregular radiopaque foreign bodies, it is unclear whether any of these might be tiny bone fragments, but no donor site is identified along the dorsal 3rd proximal or middle phalanges. 2. Third finger joint spaces appear normal.  No dislocation. Electronically Signed   By: Genevie Ann M.D.   On: 11/20/2018 18:08     EKG: Not done in ED, will get one.   Assessment/Plan Principal Problem:   Infection of left hand with open wound Active Problems:   Tobacco abuse   Tenosynovitis of finger-middle finger of left hand   Infection of left hand with open wound and tenosynovitis of middle finger of left hand: pt has significant pain, but no leukocytosis or fever.  Does not meet criteria for sepsis.  Hand surgeon, Dr. Fredna Dow was consulted, will take patient to OR tonight.  -Placed on MedSurg bed for observation -Pain control: PRN Percocet and Dilaudid -PRN Zofran for nausea - IV vancomycin -Blood culture and esr - Follow-up Dr. Levell July further recommendations.  Tobacco abuse: -Did counseling about importance of quitting smoking -Nicotine patch    DVT ppx: SCD Code Status: Full code Family Communication:   Yes, patient's girlfriend at bed side Disposition Plan:  Anticipate discharge back to previous home environment Consults called: Copy, Dr. Fredna Dow Admission status: medical floor/obs     Date of Service 11/21/2018    [facility]   If 7PM-7AM, please contact night-coverage [email or web address] Password [email or web address] 11/21/2018, 1:42 AM

## 2018-11-21 NOTE — Progress Notes (Signed)
Pt accompanied by family stated "I would go out for a walk", explained to them that they don't have off unit privileges and would need an MD's order for it. Pt made aware that if anything happens outside, they would have to go thru ED. Pt states understanding and he would go outside anyway.

## 2018-11-21 NOTE — Anesthesia Postprocedure Evaluation (Signed)
Anesthesia Post Note  Patient: Tyler Rodriguez  Procedure(s) Performed: IRRIGATION AND DEBRIDEMENT LEFT LONG FINGER (Left Finger)     Patient location during evaluation: PACU Anesthesia Type: General Level of consciousness: awake and alert, patient cooperative and oriented Pain management: pain level controlled Vital Signs Assessment: post-procedure vital signs reviewed and stable Respiratory status: spontaneous breathing, nonlabored ventilation and respiratory function stable Cardiovascular status: blood pressure returned to baseline and stable Postop Assessment: no apparent nausea or vomiting Anesthetic complications: no    Last Vitals:  Vitals:   11/21/18 0451 11/21/18 0524  BP: (!) 125/91 137/88  Pulse: 71 85  Resp: 16 16  Temp: 36.6 C (!) 36.3 C  SpO2: 96% 96%    Last Pain:  Vitals:   11/21/18 0524  TempSrc: Oral  PainSc:                  Makenley Shimp,E. Paxtyn Boyar

## 2018-11-21 NOTE — ED Notes (Signed)
Pt already OTF when OR called. 5N Charge RN called, made aware that OR is ready for pt is Short Stay RM 36

## 2018-11-21 NOTE — Progress Notes (Signed)
Pt just came back to the unit accompanied by girlfriend, denies pain and states he's okay.

## 2018-11-21 NOTE — Progress Notes (Signed)
Pharmacy Antibiotic Note  Tyler Rodriguez is a 38 y.o. male admitted on 11/20/2018 with infection of left hand with open wound and tenosynovitis of middle finger of left hand.  Pharmacy has been consulted for vancomycin dosing.  Plan: Vancomycin 1500mg  x1 then 1250mg  IV Q12H. Goal AUC 400-550.  Expected AUC 460.  SCr used 0.92.  Height: 5\' 10"  (177.8 cm) Weight: 169 lb 15.6 oz (77.1 kg) IBW/kg (Calculated) : 73  Temp (24hrs), Avg:98.2 F (36.8 C), Min:98 F (36.7 C), Max:98.4 F (36.9 C)  Recent Labs  Lab 11/20/18 1726  WBC 6.8  CREATININE 0.92  LATICACIDVEN 1.1    Estimated Creatinine Clearance: 112.4 mL/min (by C-G formula based on SCr of 0.92 mg/dL).    No Known Allergies   Thank you for allowing pharmacy to be a part of this patient's care.  Vernard Gambles, PharmD, BCPS  11/21/2018 2:13 AM

## 2018-11-21 NOTE — Op Note (Signed)
NAME: Tyler Rodriguez MEDICAL RECORD NO: [id number] DATE OF BIRTH: 09-18-1981 [facility]ATION: [facility] PHYSICIAN: Tami RibasKEVIN R. Zayden Maffei, MD   OPERATIVE REPORT   DATE OF PROCEDURE: 11/21/18    PREOPERATIVE DIAGNOSIS:   Left long finger infection including PIP joint   POSTOPERATIVE DIAGNOSIS:   Left long finger PIP joint infection with open proximal and middle phalangeal intra-articular fractures and extensor tendon laceration   PROCEDURE:   1.  Left long finger irrigation debridement of PIP joint infection 2.  Left long finger irrigation and debridement of open proximal phalanx dorsal condyle fracture with removal of skin subcutaneous tissues and bone 3.  Left long finger irrigation debridement of open middle phalanx intra-articular base fracture with removal of skin subcutaneous tissues and bone 4.  Incision volar finger to ensure no purulence within the tendon sheath   SURGEON:  Betha LoaKevin Berish Bohman, M.D.   ASSISTANT: none   ANESTHESIA:  General   INTRAVENOUS FLUIDS:  Per anesthesia flow sheet.   ESTIMATED BLOOD LOSS:  Minimal.   COMPLICATIONS:  None.   SPECIMENS:   Cultures to micro   TOURNIQUET TIME:    Total Tourniquet Time Documented: Upper Arm (laterality) - 36 minutes Total: Upper Arm (laterality) - 36 minutes    DISPOSITION:  Stable to PACU.   INDICATIONS: 38 year old right-hand-dominant male states he injured his left long finger with a grinder approximately 2 weeks ago while making a tabletop.  Over the past 72 hours he has had worsening pain swelling and erythema of the left long finger.  He presented to the emergency department.  I recommend incision and drainage in the operating room including the DIP joint possibly the flexor sheath. Risks, benefits and alternatives of surgery were discussed including the risks of blood loss, infection, damage to nerves, vessels, tendons, ligaments, bone for surgery, need for additional surgery, complications with wound healing,  continued pain, stiffness.  He voiced understanding of these risks and elected to proceed.  OPERATIVE COURSE:  After being identified preoperatively by myself,  the patient and I agreed on the procedure and site of the procedure.  The surgical site was marked.  Surgical consent had been signed.  Antibiotics were held for cultures.  He has been on some amoxicillin.  He was transferred to the operating room and placed on the operating table in supine position with the Left upper extremity on an arm board.  General anesthesia was induced by the anesthesiologist.  Left upper extremity was prepped and draped in normal sterile orthopedic fashion.  A surgical pause was performed between the surgeons, anesthesia, and operating room staff and all were in agreement as to the patient, procedure, and site of procedure.  Tourniquet at the proximal aspect of the extremity was inflated to 250 mmHg after exsanguination of the arm with an Esmarch bandage.    The wound was explored.  There is laceration of the extensor tendon over the PIP joint.  The PIP joint was exposed.  There was bone loss at the ulnar base of the middle phalanx and dorsal ulnar condyle of the proximal phalanx.  There were small bone fragments still attached to some of the soft tissue and tendon.  These were sharply removed with the knife and scissors.  There was granulation tissue and fibrinous tissue.  There was no gross purulence.  The skin edges were sharply debrided with the knife and scissors as well as the subcutaneous tissues.  This was done to remove any devitalized tissue.  The radial and  ulnar collateral ligaments appear to be intact.  The lateral bands of both the radial and ulnar sides were intact.  There was no remaining central slip.  There was swelling at the volar aspect of the proximal phalanx and he was tender in this location as well.  An incision was made here and carried in subcutaneous tissues by spreading technique.  A small rent in the  tendon sheath was made.  There is no purulence or increased fluid within the tendon sheath.  Both the volar and dorsal wounds including the PIP joint were copiously irrigated with sterile saline by cystoscopy tubing.  4-0 nylon suture was used on the volar wound to decrease the wound size while leaving the central portion open.  This wound was packed with quarter inch iodoform gauze.  The dorsal wound was then packed with quarter inch iodoform gauze.  Digital block was performed with quarter percent plain Marcaine to aid in postoperative analgesia.  The wounds were dressed with sterile 4 x 4's and wrapped with Kerlix.  A volar splint is placed and wrapped with Kerlix and Ace bandage.  The tourniquet was deflated at 36 minutes.  Fingertips were pink with brisk capillary refill after deflation of tourniquet.  The operative  drapes were broken down.    There was some bleeding from the wound saturating the dressing.  The dressing was taken down.  There was hemostasis.  A bipolar was used to treat a couple of small areas on the dorsal wound edges that would bleed with palpation of the area.  The wounds were then again dressed with sterile 4 x 4's and ABD and wrapped with a Kerlix bandage.  Volar splints placed and wrapped with Kerlix and Ace bandage.  There was hemostasis at this time.  The patient was awoken from anesthesia safely.  He was transferred back to the stretcher and taken to PACU in stable condition.    He is admitted to the hospitalist service for IV antibiotics.  We will start hydrotherapy in 2 to 4 days.  I will see him back in the office early next week.  Betha LoaKevin Cloria Ciresi, MD Electronically signed, 11/21/18

## 2018-11-21 NOTE — Progress Notes (Signed)
Status post irrigation debridement of left long finger infected wound including PIP joint.  There was open fracture of the proximal and middle phalanges.  No gross purulence.  Small incision made volarly to ensure no flexor sheath infection which there was not.  Start hydrotherapy in 2 to 4 days.  Okay for discharge from hand standpoint when afebrile, with normal white blood count, no erythema proximal to dressing, pain controlled.  Recommend Bactrim as oral antibiotic on discharge due to open fracture component.

## 2018-11-21 NOTE — Progress Notes (Signed)
Have checked the Pt's room multiple times since Pt had left without informing RN, Pt still has not returned. MD notified. Consulting civil engineer, ADON notified.

## 2018-11-21 NOTE — Progress Notes (Signed)
Rounded on pt, pt not in room. Searched the unit and pt is not on the unit.

## 2018-11-21 NOTE — Transfer of Care (Signed)
Immediate Anesthesia Transfer of Care Note  Patient: Tyler Rodriguez  Procedure(s) Performed: IRRIGATION AND DEBRIDEMENT LEFT LONG FINGER (Left Finger)  Patient Location: PACU  Anesthesia Type:General  Level of Consciousness: drowsy  Airway & Oxygen Therapy: Patient Spontanous Breathing  Post-op Assessment: Report given to RN and Post -op Vital signs reviewed and stable  Post vital signs: Reviewed and stable  Last Vitals:  Vitals Value Taken Time  BP    Temp    Pulse    Resp    SpO2      Last Pain:  Vitals:   11/21/18 0215  TempSrc: Oral  PainSc:          Complications: No apparent anesthesia complications

## 2018-11-21 NOTE — Plan of Care (Signed)
  Problem: Education: Goal: Knowledge of General Education information will improve Description Including pain rating scale, medication(s)/side effects and non-pharmacologic comfort measures Outcome: Progressing   Problem: Clinical Measurements: Goal: Ability to maintain clinical measurements within normal limits will improve Outcome: Progressing Goal: Will remain free from infection Outcome: Progressing Goal: Diagnostic test results will improve Outcome: Progressing Goal: Respiratory complications will improve Outcome: Not Applicable Goal: Cardiovascular complication will be avoided Outcome: Progressing   Problem: Activity: Goal: Risk for activity intolerance will decrease Outcome: Progressing   

## 2018-11-21 NOTE — Discharge Instructions (Signed)

## 2018-11-21 NOTE — Anesthesia Procedure Notes (Signed)
Procedure Name: LMA Insertion Date/Time: 11/21/2018 3:11 AM Performed by: Claudina Lick, CRNA Pre-anesthesia Checklist: Patient identified, Emergency Drugs available, Suction available, Patient being monitored and Timeout performed Patient Re-evaluated:Patient Re-evaluated prior to induction Oxygen Delivery Method: Circle system utilized Preoxygenation: Pre-oxygenation with 100% oxygen Induction Type: IV induction Ventilation: Mask ventilation without difficulty LMA: LMA inserted LMA Size: 4.0 Placement Confirmation: positive ETCO2 and breath sounds checked- equal and bilateral Tube secured with: Tape Dental Injury: Teeth and Oropharynx as per pre-operative assessment

## 2018-11-21 NOTE — Anesthesia Preprocedure Evaluation (Addendum)
Anesthesia Evaluation  Patient identified by MRN, date of birth, ID band Patient awake    Reviewed: Allergy & Precautions, NPO status , Patient's Chart, lab work & pertinent test results  History of Anesthesia Complications Negative for: history of anesthetic complications  Airway Mallampati: I  TM Distance: >3 FB Neck ROM: Full    Dental  (+) Teeth Intact, Dental Advisory Given   Pulmonary Current Smoker,    breath sounds clear to auscultation       Cardiovascular negative cardio ROS   Rhythm:Regular     Neuro/Psych negative neurological ROS     GI/Hepatic negative GI ROS, Neg liver ROS,   Endo/Other  negative endocrine ROS  Renal/GU stones     Musculoskeletal negative musculoskeletal ROS (+)   Abdominal   Peds  Hematology negative hematology ROS (+)   Anesthesia Other Findings   Reproductive/Obstetrics                            Anesthesia Physical Anesthesia Plan  ASA: II and emergent  Anesthesia Plan: General   Post-op Pain Management:    Induction: Intravenous  PONV Risk Score and Plan: 1 and Ondansetron and Dexamethasone  Airway Management Planned: LMA  Additional Equipment:   Intra-op Plan:   Post-operative Plan:   Informed Consent: I have reviewed the patients History and Physical, chart, labs and discussed the procedure including the risks, benefits and alternatives for the proposed anesthesia with the patient or authorized representative who has indicated his/her understanding and acceptance.     Dental advisory given  Plan Discussed with: CRNA, Surgeon and Anesthesiologist  Anesthesia Plan Comments:       Anesthesia Quick Evaluation

## 2018-11-21 NOTE — H&P (Addendum)
Tyler Rodriguez is an 38 y.o. male.   Chief Complaint: left long finger infection HPI: 38 yo rhd male states he injured left long finger with grinder 2 weeks ago.  Over last 72 hours has had increasing pain, swelling, erythema of finger at mp joint.  No fevers, chills, sweats.  Describes a sore pain that has worsened.  Worse with motion/palpation.  Alleviated with immobilization.  Associated purulent drainage.  Case discussed with Dionne Bucy, MD and his note from 11/21/2018 reviewed. Xrays viewed and interpreted by me: 3 views left long finger show no dislocation.  Foreign body vs bone fragments noted dorsal to pip joint. Labs reviewed: WBC6.8  Allergies: No Known Allergies  Past Medical History:  Diagnosis Date  . Kidney stones     Past Surgical History:  Procedure Laterality Date  . LEG SURGERY Left     Family History: History reviewed. No pertinent family history.  Social History:   reports that he has been smoking. He has never used smokeless tobacco. He reports that he does not drink alcohol or use drugs.  Medications: (Not in a hospital admission)   Results for orders placed or performed during the hospital encounter of 11/20/18 (from the past 48 hour(s))  Lactic acid, plasma     Status: None   Collection Time: 11/20/18  5:26 PM  Result Value Ref Range   Lactic Acid, Venous 1.1 0.5 - 1.9 mmol/L    Comment: Performed at [facility], [address] [place], [place] [address]  Comprehensive metabolic panel     Status: Abnormal   Collection Time: 11/20/18  5:26 PM  Result Value Ref Range   Sodium 140 135 - 145 mmol/L   Potassium 3.6 3.5 - 5.1 mmol/L   Chloride 106 98 - 111 mmol/L   CO2 27 22 - 32 mmol/L   Glucose, Bld 107 (H) 70 - 99 mg/dL   BUN 13 6 - 20 mg/dL   Creatinine, Ser 0.76 0.61 - 1.24 mg/dL   Calcium 8.9 8.9 - 22.6 mg/dL   Total Protein 7.0 6.5 - 8.1 g/dL   Albumin 4.7 3.5 - 5.0 g/dL   AST 27 15 - 41 U/L   ALT 24 0 - 44 U/L   Alkaline  Phosphatase 73 38 - 126 U/L   Total Bilirubin 0.9 0.3 - 1.2 mg/dL   GFR calc non Af Amer >60 >60 mL/min   GFR calc Af Amer >60 >60 mL/min   Anion gap 7 5 - 15    Comment: Performed at [facility], [address] [place], [place] [address]  CBC with Differential     Status: None   Collection Time: 11/20/18  5:26 PM  Result Value Ref Range   WBC 6.8 4.0 - 10.5 K/uL   RBC 4.50 4.22 - 5.81 MIL/uL   Hemoglobin 14.2 13.0 - 17.0 g/dL   HCT 56.2 56.3 - 89.3 %   MCV 95.3 80.0 - 100.0 fL   MCH 31.6 26.0 - 34.0 pg   MCHC 33.1 30.0 - 36.0 g/dL   RDW 73.4 28.7 - 68.1 %   Platelets 245 150 - 400 K/uL   nRBC 0.0 0.0 - 0.2 %   Neutrophils Relative % 76 %   Neutro Abs 5.2 1.7 - 7.7 K/uL   Lymphocytes Relative 12 %   Lymphs Abs 0.8 0.7 - 4.0 K/uL   Monocytes Relative 10 %   Monocytes Absolute 0.7 0.1 - 1.0 K/uL   Eosinophils Relative 2 %  Eosinophils Absolute 0.1 0.0 - 0.5 K/uL   Basophils Relative 0 %   Basophils Absolute 0.0 0.0 - 0.1 K/uL   Immature Granulocytes 0 %   Abs Immature Granulocytes 0.01 0.00 - 0.07 K/uL    Comment: Performed at [facility], [address], [place], [place] [address]    Dg Finger Middle Left  Result Date: 11/20/2018 CLINICAL DATA:  38 year old male status post injury to middle finger on angle grinder 1 week ago but abrupt onset swelling and redness yesterday. EXAM: LEFT MIDDLE FINGER 2+V COMPARISON:  None. FINDINGS: Moderate to severe soft tissue swelling. No soft tissue gas identified, but dorsal to the left 3rd PIP there are multiple 2-3 millimeter irregular radiopaque foreign bodies. Given their density, to possible 1 or more of these might be tiny bone fragments, although no donor site is identified off of the dorsal left 3rd proximal or middle phalanges. The 3rd PIP and DIP appear aligned and normal. Other visible osseous structures appear intact. There are healed chronic deformities of the distal left 4th and 5th metacarpals. IMPRESSION:  1. Moderate to severe soft tissue swelling at the left 3rd PIP with multiple 2-3 mm irregular radiopaque foreign bodies, it is unclear whether any of these might be tiny bone fragments, but no donor site is identified along the dorsal 3rd proximal or middle phalanges. 2. Third finger joint spaces appear normal.  No dislocation. Electronically Signed   By: Odessa Fleming M.D.   On: 11/20/2018 18:08     A comprehensive review of systems was negative. Review of Systems: No fevers, chills, night sweats, chest pain, shortness of breath, nausea, vomiting, diarrhea, constipation, easy bleeding or bruising, headaches, dizziness, vision changes, fainting.   Blood pressure 112/81, pulse 81, temperature 98.4 F (36.9 C), temperature source Oral, resp. rate 17, height 5\' 10"  (1.778 m), weight 77.1 kg, SpO2 98 %.  General appearance: alert, cooperative and appears stated age Head: Normocephalic, without obvious abnormality, atraumatic Neck: supple, symmetrical, trachea midline Extremities: Intact sensation and capillary refill all digits.  +epl/fpl/io.  Left long finger with dorsal wound at PIP joint with purulent drainage.  Finger swollen and erythematous at pip joint with erythema onto dorsum of hand.  Minimal swelling volar tissues of finger but some tenderness.  A/prom finger possible with some discomfort. Pulses: 2+ and symmetric Skin: Skin color, texture, turgor normal. No rashes or lesions Neurologic: Grossly normal Incision/Wound: as above  Assessment/Plan Left long finger pip joint infection.  Recommend OR for incision and drainage possibly including flexor sheath.  Risks, benefits and alternatives of surgery were discussed including risks of blood loss, infection, damage to nerves/vessels/tendons/ligament/bone, failure of surgery, need for additional surgery, complication with wound healing, stiffness, continued infection, need for repeat incision and drainage.  He voiced understanding of these risks and  elected to proceed.    Betha Loa 11/21/2018, 1:23 AM

## 2018-11-21 NOTE — Progress Notes (Signed)
MD notified about Pt leaving the unit. Received call from girlfriend at 9:29 requesting for antibiotics and pain meds prescription to be called in to pharmacy. Explained that Pt needs to be cleared and discharged  for the prescriptions to be signed in. Explained AMA protocol to her and that they need to go back to sign papers if they are leaving AMA. Girldfriend stated that they will be back in the unit in 20 mins.

## 2018-11-23 NOTE — Discharge Summary (Signed)
Brief discharge summary:  As per H&P done on presentation " Tyler Rodriguez is a 38 y.o. male with medical history significant of kidney stone, tobacco abuse, who presents with left hand pain.  Pt states that he cut his left middle finger accidentally by an angle grinder 2 weeks ago. He states that initially he feel fine and it seems have been healing, but 2 days ago His left middle finger started to become more painful and swollen.  It has been progressively worsening.  The redness has been spreading up to hand. He noted some pus draining from the cut. The pain is constant, sharp, 8 out of 10 severity, nonradiating, aggravated by moving finger.  Patient denies fever or chills.  No nausea vomiting, diarrhea or abdominal pain.  No chest pain, shortness breath or cough. Patient was initially seen in [facility], and transferred to [facility] due to the need of hand surgeon consultation. Pt was given IV Rocephin in [facility].  ED Course: pt was found to have WBC 6.8, lactic acid 1.1, electrolytes renal function okay, temperature normal, slightly tachycardia, oxygen saturation 98% on room air.  Patient is placed on MedSurg [facility] for observation.  Hand surgeon, Dr. Maryan Rued was consulted.  X-ray showed: 1. Moderate to severe soft tissue swelling at the left 3rd PIP with multiple 2-3 mm irregular radiopaque foreign bodies, it is unclear whether any of these might be tiny bone fragments, but no donor site is identified along the dorsal 3rd proximal or middle phalanges. 2. Third finger joint spaces appear normal. No dislocation.  Review of Systems:   General: no fevers, chills, no body weight gain, has poor appetite, has fatigue HEENT: no blurry vision, hearing changes or sore throat Respiratory: no dyspnea, coughing, wheezing CV: no chest pain, no palpitations GI: no nausea, vomiting, abdominal pain, diarrhea, constipation GU: no dysuria, burning on urination,  increased urinary frequency, hematuria  Ext: no leg edema Neuro: no unilateral weakness, numbness, or tingling, no vision change or hearing loss Skin: no rash. Has left hand pain with an open wound in the left middle finger. MSK: No muscle spasm, no deformity, no limitation of range of movement in spin Heme: No easy bruising.  Travel history: No recent long distant travel.  Allergy: No Known Allergies      Past Medical History:  Diagnosis Date  . Kidney stones          Past Surgical History:  Procedure Laterality Date  . LEG SURGERY Left     Social History:  reports that he has been smoking. He has never used smokeless tobacco. He reports that he does not drink alcohol or use drugs.  Family History: Reviewed with patient, patient states that all family members are healthy.         Prior to Admission medications   Medication Sig Start Date End Date Taking? Authorizing Provider  HYDROmorphone (DILAUDID) 2 MG tablet Take 1 tablet (2 mg total) by mouth every 4 (four) hours as needed for severe pain. Take 1-2 tabs 10/02/15   Hollice Espy, MD  tamsulosin (FLOMAX) 0.4 MG CAPS capsule Take 1 capsule (0.4 mg total) by mouth daily. 10/02/15   Hollice Espy, MD  traMADol (ULTRAM) 50 MG tablet Take 1 tablet (50 mg total) by mouth every 6 (six) hours as needed. 10/29/16   Loney Hering, MD    Physical Exam:       Vitals:   11/20/18 1610 11/20/18 2345 11/21/18 0000 11/21/18 0015  BP:  100/81 110/72 112/81  Pulse:  93 89 81  Resp:      Temp:      TempSrc:      SpO2:  100% 100% 98%  Weight: 77.1 kg     Height: _0  (1.778 m)      General: Not in acute distress HEENT:       Eyes: PERRL, EOMI, no scleral icterus.       ENT: No discharge from the ears and nose, no pharynx injection, no tonsillar enlargement.        Neck: No JVD, no bruit, no mass felt. Heme: No neck lymph node enlargement. Cardiac: S1/S2, RRR, No murmurs, No  gallops or rubs. Respiratory: Good air movement bilaterally. No rales, wheezing, rhonchi or rubs. GI: Soft, nondistended, nontender, no rebound pain, no organomegaly, BS present. GU: No hematuria Ext: No pitting leg edema bilaterally. 2+DP/PT pulse bilaterally. Musculoskeletal: No joint deformities, No joint redness or warmth, no limitation of ROM in spin. Skin: has an open wound in the dorsal aspect of left middle finger over PIP joint. The left middle finger has significant swelling, erythema, warmth and tenderness, spreading up to the dorsal aspect of the hand, has some serosanguineous liquid draining from the wound. Neuro: Alert, oriented X3, cranial nerves II-XII grossly intact, moves all extremities normally.  Psych: Patient is not psychotic, no suicidal or hemocidal ideation.  Labs on Admission: I have personally reviewed following labs and imaging studies  CBC: LastLabs     Recent Labs  Lab 11/20/18 1726  WBC 6.8  NEUTROABS 5.2  HGB 14.2  HCT 42.9  MCV 95.3  PLT 245     Basic Metabolic Panel: LastLabs     Recent Labs  Lab 11/20/18 1726  NA 140  K 3.6  CL 106  CO2 27  GLUCOSE 107*  BUN 13  CREATININE 0.92  CALCIUM 8.9     GFR: Estimated Creatinine Clearance: 112.4 mL/min (by C-G formula based on SCr of 0.92 mg/dL). Liver Function Tests: LastLabs     Recent Labs  Lab 11/20/18 1726  AST 27  ALT 24  ALKPHOS 73  BILITOT 0.9  PROT 7.0  ALBUMIN 4.7     LastLabs  No results for input(s): LIPASE, AMYLASE in the last 168 hours.   LastLabs  No results for input(s): AMMONIA in the last 168 hours.   Coagulation Profile: LastLabs  No results for input(s): INR, PROTIME in the last 168 hours.   Cardiac Enzymes: LastLabs  No results for input(s): CKTOTAL, CKMB, CKMBINDEX, TROPONINI in the last 168 hours.   BNP (last 3 results) RecentLabs(withinlast365days)  No results for input(s): PROBNP in the last 8760 hours.    HbA1C: RecentLabs(last2labs)  No results for input(s): HGBA1C in the last 72 hours.   CBG: LastLabs  No results for input(s): GLUCAP in the last 168 hours.   Lipid Profile: RecentLabs(last2labs)  No results for input(s): CHOL, HDL, LDLCALC, TRIG, CHOLHDL, LDLDIRECT in the last 72 hours.   Thyroid Function Tests: RecentLabs(last2labs)  No results for input(s): TSH, T4TOTAL, FREET4, T3FREE, THYROIDAB in the last 72 hours.   Anemia Panel: RecentLabs(last2labs)  No results for input(s): VITAMINB12, FOLATE, FERRITIN, TIBC, IRON, RETICCTPCT in the last 72 hours.   Urine analysis: Labs(Brief)          Component Value Date/Time   COLORURINE YELLOW (A) 10/29/2016 0222   APPEARANCEUR CLEAR (A) 10/29/2016 0222   LABSPEC 1.024 10/29/2016 0222   PHURINE 5.0 10/29/2016 0222  GLUCOSEU NEGATIVE 10/29/2016 0222   HGBUR NEGATIVE 10/29/2016 0222   BILIRUBINUR NEGATIVE 10/29/2016 0222   KETONESUR NEGATIVE 10/29/2016 0222   PROTEINUR NEGATIVE 10/29/2016 0222   NITRITE NEGATIVE 10/29/2016 0222   LEUKOCYTESUR NEGATIVE 10/29/2016 0222     Sepsis Labs: _0 (procalcitonin:4,lacticidven:4) )No results found for this or any previous visit (from the past 240 hour(s)).   Radiological Exams on Admission:  ImagingResults(Last48hours)  Dg Finger Middle Left  Result Date: 11/20/2018 CLINICAL DATA:  38 year old male status post injury to middle finger on angle grinder 1 week ago but abrupt onset swelling and redness yesterday. EXAM: LEFT MIDDLE FINGER 2+V COMPARISON:  None. FINDINGS: Moderate to severe soft tissue swelling. No soft tissue gas identified, but dorsal to the left 3rd PIP there are multiple 2-3 millimeter irregular radiopaque foreign bodies. Given their density, to possible 1 or more of these might be tiny bone fragments, although no donor site is identified off of the dorsal left 3rd proximal or middle phalanges. The 3rd PIP and DIP appear  aligned and normal. Other visible osseous structures appear intact. There are healed chronic deformities of the distal left 4th and 5th metacarpals. IMPRESSION: 1. Moderate to severe soft tissue swelling at the left 3rd PIP with multiple 2-3 mm irregular radiopaque foreign bodies, it is unclear whether any of these might be tiny bone fragments, but no donor site is identified along the dorsal 3rd proximal or middle phalanges. 2. Third finger joint spaces appear normal.  No dislocation. Electronically Signed   By: Genevie Ann M.D.   On: 11/20/2018 18:08      EKG: Not done in ED, will get one.   Assessment/Plan Principal Problem:   Infection of left hand with open wound Active Problems:   Tobacco abuse   Tenosynovitis of finger-middle finger of left hand   Infection of left hand with open wound and tenosynovitis of middle finger of left hand: pt has significant pain, but no leukocytosis or fever.  Does not meet criteria for sepsis.  Hand surgeon, Dr. Fredna Dow was consulted, will take patient to OR tonight.  -Placed on MedSurg bed for observation -Pain control: PRN Percocet and Dilaudid -PRN Zofran for nausea - IV vancomycin -Blood culture and esr - Follow-up Dr. Levell July further recommendations.  Tobacco abuse: -Did counseling about importance of quitting smoking -Nicotine patch    DVT ppx: SCD Code Status: Full code Family Communication:   Yes, patient's girlfriend at bed side Disposition Plan:  Anticipate discharge back to previous home environment Consults called: Copy, Dr. Fredna Dow Admission status: medical floor/obs     Date of Service 11/21/2018    Ivor Costa [facility]"  Patient left the hospital before I could see him. Essentially, have not had any contact with this patient.

## 2018-11-24 ENCOUNTER — Encounter (HOSPITAL_COMMUNITY): Payer: Self-pay | Admitting: Orthopedic Surgery

## 2018-11-26 LAB — AEROBIC/ANAEROBIC CULTURE W GRAM STAIN (SURGICAL/DEEP WOUND): Gram Stain: NONE SEEN

## 2018-11-26 LAB — CULTURE, BLOOD (ROUTINE X 2): Culture: NO GROWTH

## 2018-11-26 LAB — AEROBIC/ANAEROBIC CULTURE (SURGICAL/DEEP WOUND)

## 2019-07-17 IMAGING — CR DG FINGER MIDDLE 2+V*L*
3 series · 3 of 3 positions shown · non-contrast
Comparison: None.

CLINICAL DATA: 37-year-old male status post injury to middle finger
on angle grinder 1 week ago but abrupt onset swelling and redness
yesterday.

EXAM:
LEFT MIDDLE FINGER 2+V

[finger ap]
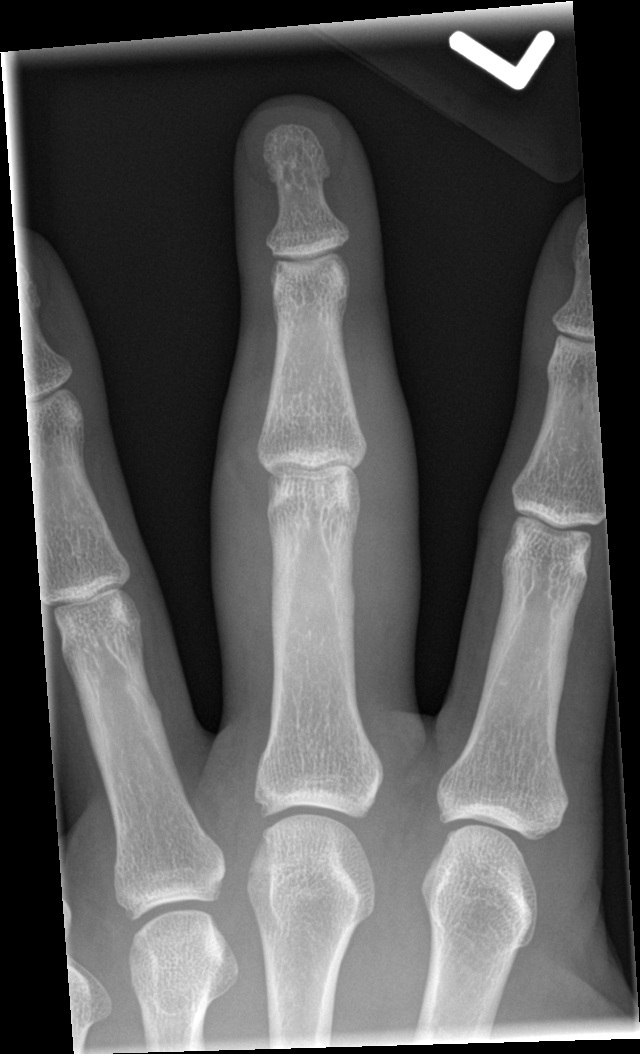

[finger obl]
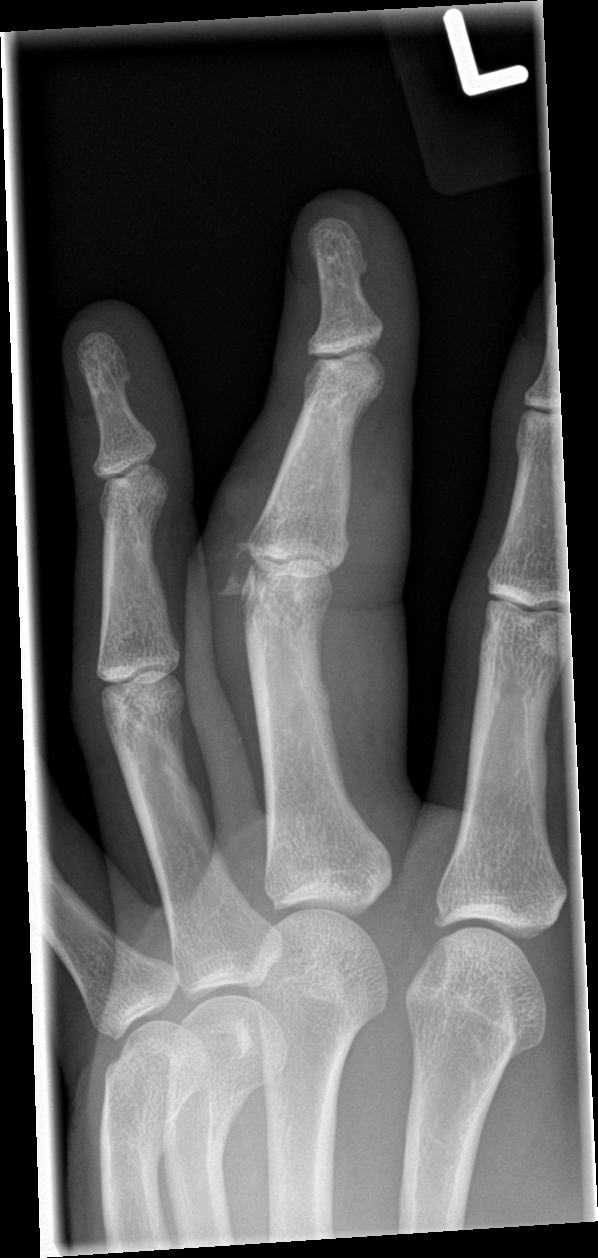

[finger lat]
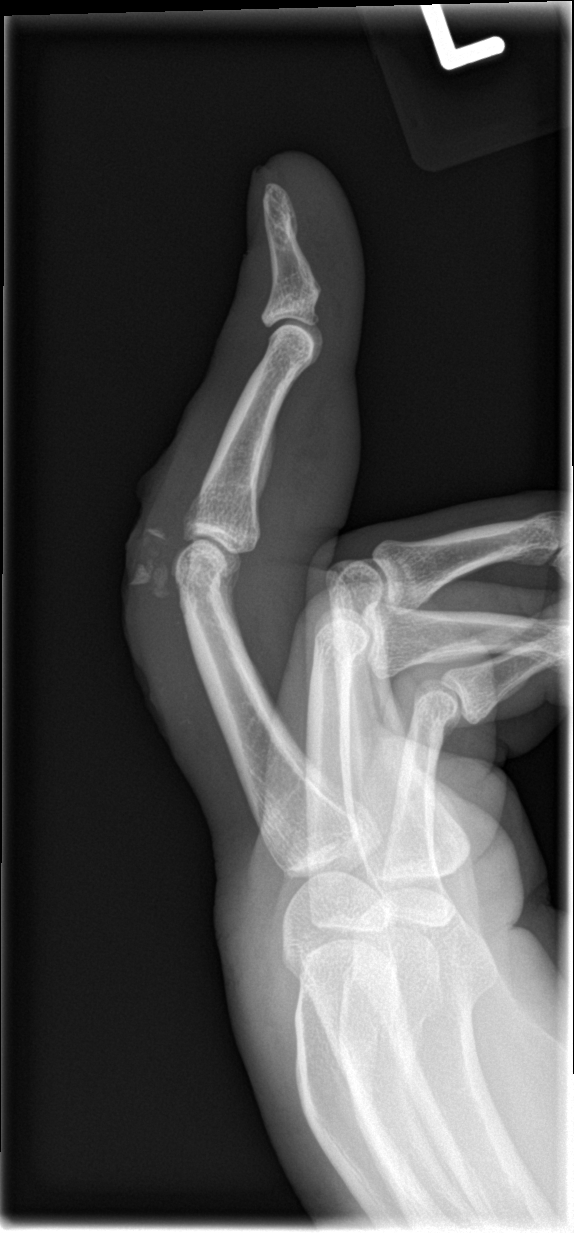

[3 of 3 positions shown; findings below may reference images not displayed]

FINDINGS: Moderate to severe soft tissue swelling. No soft tissue gas
identified, but dorsal to the left 3rd PIP there are multiple 2-3
millimeter irregular radiopaque foreign bodies. Given their density,
to possible 1 or more of these might be tiny bone fragments,
although no donor site is identified off of the dorsal left 3rd
proximal or middle phalanges. The 3rd PIP and DIP appear aligned and
normal.

Other visible osseous structures appear intact. There are healed
chronic deformities of the distal left 4th and 5th metacarpals.
IMPRESSION: 1. Moderate to severe soft tissue swelling at the left 3rd PIP with
multiple 2-3 mm irregular radiopaque foreign bodies, it is unclear
whether any of these might be tiny bone fragments, but no donor site
is identified along the dorsal 3rd proximal or middle phalanges.
2. Third finger joint spaces appear normal.  No dislocation.
